# Patient Record
Sex: Female | Born: 1945 | Race: Black or African American | Hispanic: No | Smoking: Former smoker
Health system: Southern US, Community
[De-identification: ages and names within clinical notes are randomized; demographics above are authoritative.]

## PROBLEM LIST (undated history)

## (undated) DIAGNOSIS — E785 Hyperlipidemia, unspecified: Secondary | ICD-10-CM

## (undated) DIAGNOSIS — D649 Anemia, unspecified: Secondary | ICD-10-CM

## (undated) DIAGNOSIS — R7303 Prediabetes: Secondary | ICD-10-CM

## (undated) DIAGNOSIS — F419 Anxiety disorder, unspecified: Secondary | ICD-10-CM

## (undated) DIAGNOSIS — M1990 Unspecified osteoarthritis, unspecified site: Secondary | ICD-10-CM

## (undated) DIAGNOSIS — I10 Essential (primary) hypertension: Secondary | ICD-10-CM

## (undated) HISTORY — DX: Hyperlipidemia, unspecified: E78.5

## (undated) HISTORY — DX: Essential (primary) hypertension: I10

## (undated) HISTORY — PX: ABDOMINAL HYSTERECTOMY: SHX81

## (undated) HISTORY — PX: TOTAL KNEE ARTHROPLASTY: SHX125

## (undated) HISTORY — PX: COLONOSCOPY: SHX174

## (undated) HISTORY — PX: BREAST CYST EXCISION: SHX579

---

## 1973-12-15 HISTORY — PX: OTHER SURGICAL HISTORY: SHX169

## 2001-04-01 ENCOUNTER — Other Ambulatory Visit: Admission: RE | Admit: 2001-04-01 | Discharge: 2001-04-01 | Payer: Self-pay | Admitting: Family Medicine

## 2001-07-13 ENCOUNTER — Ambulatory Visit (HOSPITAL_COMMUNITY): Admission: RE | Admit: 2001-07-13 | Discharge: 2001-07-13 | Payer: Self-pay | Admitting: Family Medicine

## 2001-07-13 ENCOUNTER — Encounter: Payer: Self-pay | Admitting: Family Medicine

## 2002-02-28 ENCOUNTER — Other Ambulatory Visit: Admission: RE | Admit: 2002-02-28 | Discharge: 2002-02-28 | Payer: Self-pay | Admitting: Family Medicine

## 2002-07-19 ENCOUNTER — Encounter: Payer: Self-pay | Admitting: Family Medicine

## 2002-07-19 ENCOUNTER — Ambulatory Visit (HOSPITAL_COMMUNITY): Admission: RE | Admit: 2002-07-19 | Discharge: 2002-07-19 | Payer: Self-pay | Admitting: Family Medicine

## 2003-07-24 ENCOUNTER — Encounter: Payer: Self-pay | Admitting: Family Medicine

## 2003-07-24 ENCOUNTER — Ambulatory Visit (HOSPITAL_COMMUNITY): Admission: RE | Admit: 2003-07-24 | Discharge: 2003-07-24 | Payer: Self-pay | Admitting: Family Medicine

## 2004-07-30 ENCOUNTER — Ambulatory Visit (HOSPITAL_COMMUNITY): Admission: RE | Admit: 2004-07-30 | Discharge: 2004-07-30 | Payer: Self-pay | Admitting: Family Medicine

## 2004-08-06 ENCOUNTER — Ambulatory Visit (HOSPITAL_COMMUNITY): Admission: RE | Admit: 2004-08-06 | Discharge: 2004-08-06 | Payer: Self-pay | Admitting: Family Medicine

## 2004-08-29 ENCOUNTER — Emergency Department (HOSPITAL_COMMUNITY): Admission: EM | Admit: 2004-08-29 | Discharge: 2004-08-29 | Payer: Self-pay | Admitting: Emergency Medicine

## 2005-08-25 ENCOUNTER — Ambulatory Visit (HOSPITAL_COMMUNITY): Admission: RE | Admit: 2005-08-25 | Discharge: 2005-08-25 | Payer: Self-pay | Admitting: Family Medicine

## 2005-09-05 ENCOUNTER — Ambulatory Visit (HOSPITAL_COMMUNITY): Admission: RE | Admit: 2005-09-05 | Discharge: 2005-09-05 | Payer: Self-pay | Admitting: Family Medicine

## 2005-10-01 ENCOUNTER — Ambulatory Visit: Payer: Self-pay | Admitting: Internal Medicine

## 2005-10-02 ENCOUNTER — Encounter: Payer: Self-pay | Admitting: Internal Medicine

## 2005-10-02 ENCOUNTER — Ambulatory Visit (HOSPITAL_COMMUNITY): Admission: RE | Admit: 2005-10-02 | Discharge: 2005-10-02 | Payer: Self-pay | Admitting: Internal Medicine

## 2005-12-18 ENCOUNTER — Encounter: Payer: Self-pay | Admitting: Obstetrics and Gynecology

## 2005-12-18 ENCOUNTER — Inpatient Hospital Stay (HOSPITAL_COMMUNITY): Admission: RE | Admit: 2005-12-18 | Discharge: 2005-12-19 | Payer: Self-pay | Admitting: Obstetrics and Gynecology

## 2006-09-21 ENCOUNTER — Ambulatory Visit (HOSPITAL_COMMUNITY): Admission: RE | Admit: 2006-09-21 | Discharge: 2006-09-21 | Payer: Self-pay | Admitting: Family Medicine

## 2007-10-21 ENCOUNTER — Ambulatory Visit (HOSPITAL_COMMUNITY): Admission: RE | Admit: 2007-10-21 | Discharge: 2007-10-21 | Payer: Self-pay | Admitting: Family Medicine

## 2008-10-24 ENCOUNTER — Ambulatory Visit (HOSPITAL_COMMUNITY): Admission: RE | Admit: 2008-10-24 | Discharge: 2008-10-24 | Payer: Self-pay | Admitting: Family Medicine

## 2009-11-12 ENCOUNTER — Ambulatory Visit (HOSPITAL_COMMUNITY): Admission: RE | Admit: 2009-11-12 | Discharge: 2009-11-12 | Payer: Self-pay | Admitting: Family Medicine

## 2010-11-19 ENCOUNTER — Ambulatory Visit (HOSPITAL_COMMUNITY)
Admission: RE | Admit: 2010-11-19 | Discharge: 2010-11-19 | Payer: Self-pay | Source: Home / Self Care | Admitting: Family Medicine

## 2010-11-29 ENCOUNTER — Ambulatory Visit (HOSPITAL_COMMUNITY)
Admission: RE | Admit: 2010-11-29 | Discharge: 2010-11-29 | Payer: Self-pay | Source: Home / Self Care | Attending: Family Medicine | Admitting: Family Medicine

## 2010-12-24 ENCOUNTER — Ambulatory Visit (HOSPITAL_COMMUNITY)
Admission: RE | Admit: 2010-12-24 | Discharge: 2010-12-24 | Payer: Self-pay | Source: Home / Self Care | Attending: Family Medicine | Admitting: Family Medicine

## 2011-01-05 ENCOUNTER — Encounter: Payer: Self-pay | Admitting: Family Medicine

## 2011-05-02 NOTE — Op Note (Signed)
NAME:  [name], [name]         ACCOUNT NO.:  [id number]   MEDICAL RECORD NO.:  [id number]          PATIENT TYPE:  AMB   LOCATION:  DAY                           FACILITY:  [facility]   PHYSICIAN:  R. Roetta Sessions, M.D. DATE OF BIRTH:  April 03, 1946   DATE OF PROCEDURE:  10/02/2005  DATE OF DISCHARGE:                                 OPERATIVE REPORT   PROCEDURE PERFORMED:  Colonoscopy screening with biopsy.   INDICATIONS FOR PROCEDURE:  The patient is a 65 year old lady referred  through courtesy of Angus G. McInnis, MD for colorectal cancer screening.  She has no lower GI tract symptoms.  She has never had her lower GI tract  imaged.  There is no family history of colorectal neoplasia.  Colonoscopy is  now being done as a screening maneuver.  This approach has been discussed  with the patient at length.  Potential risks, benefits and alternatives have  been reviewed, questions have been answered, she is agreeable.  Please see  documentation in the medical records.   PROCEDURE NOTE:  Oxygen saturations, blood pressure, pulse and respirations  were monitored throughout the entire procedure.  Conscious sedation Versed 3  mg IV, Demerol 75 mg IV in divided doses.   INSTRUMENT USED:  Olympus video chip system.   FINDINGS:  Digital exam revealed no abnormalities.   ENDOSCOPIC FINDINGS:  Prep was good.   Rectum:  Examination of the rectal mucosa including retroflex view of the  anal verge revealed no abnormalities.  Colon:  Colonic mucosa was surveyed from the rectosigmoid junction to the  left, transverse and right colon to the area of the appendiceal orifice and  ileocecal valve and cecum.  These structures were well seen and photographed  for the record.  From this level, the scope was slowly withdrawn.  All  previously mentioned mucosal surfaces were again seen.  The patient had a  couple of 2 to 3 mm mammillations in the sigmoid colon around 30 cm. The  remainder of the colonic  mucosa was well seen and appeared normal.  This  area of abnormality was biopsied for histologic study.  The patient  tolerated the procedure well, was reacted in endoscopy.   IMPRESSION:  1.  Normal rectum.  2.  1 to 2 mm mammillations at 30 cm of doubtful clinical significance,      biopsied.  Otherwise normal colon.   RECOMMENDATIONS:  1.  Follow-up on path.  2.  Further recommendations to follow.      Jonathon Bellows, M.D.  Electronically Signed     RMR/MEDQ  D:  10/02/2005  T:  10/02/2005  Job:  [id number]   cc:   Angus G. Renard Matter, MD  Fax: [phone]

## 2011-05-02 NOTE — Discharge Summary (Signed)
[name], [name]         ACCOUNT NO.:  [id number]   MEDICAL RECORD NO.:  [id number]          PATIENT TYPE:  INP   LOCATION:  A427                          FACILITY:  [facility]   PHYSICIAN:  Tilda Burrow, M.D. DATE OF BIRTH:  May 21, 1946   DATE OF ADMISSION:  12/18/2005  DATE OF DISCHARGE:  01/05/2007LH                                 DISCHARGE SUMMARY   ADMISSION DIAGNOSES:  1.  Uterine cystocele.  2.  Mild rectocele.  3.  Left labia majora sebaceous cyst.   DISCHARGE DIAGNOSES:  1.  Uterine cystocele.  2.  Mild rectocele.  3.  Left labia majora sebaceous cyst.   PROCEDURES:  1.  On December 18, 2005, vaginal hysterectomy with bilateral salpingo-      oophorectomy, anterior and posterior repair.  2.  Excision of left labial sebaceous cyst.   DISCHARGE MEDICATIONS:  1.  Evista 60 mg p.o. daily.  2.  Toprol XL 25 mg p.o. daily.  3.  Centrum Silver one tablet daily.  4.  Calcium 600 mg Plus D two tablets daily.  5.  Clorazepate __________ mg p.o. p.r.n. anxiety.  6.  Stool softener daily.  7.  Colace 100 mg b.i.d.  8.  Vicodin 5/500 one q.4 h p.r.n. pain, dispensed #30.   HOSPITAL SUMMARY:  This 65 year old female patient of Dr. Butch Penny was  admitted for [facility] BSO, anterior and posterior repair with removal of  sebaceous cyst as described in HPI.   HOSPITAL COURSE:  The patient was admitted, underwent hysterectomy December 18, 2005, as described in the operative report.  Pathology showed a 46 gram  uterus with benign vaginal mucosa, benign organs without evidence of  malignancy and sebaceous cyst in the left labia majora.   Postoperative course was uneventful with postoperative hemoglobin of 11.4,  hematocrit 33.6 compared to 13.5 and 39.6 on admission.  Blood type is A  positive.  No transfusions required.  Other electrolytes include potassium  3.7, BUN 13, creatinine 0.8.  The patient was discharged on December 19, 2005,  to be followed up in one week and then  four weeks.      Tilda Burrow, M.D.  Electronically Signed     JVF/MEDQ  D:  01/14/2006  T:  01/15/2006  Job:  [id number]   cc:   Angus G. Renard Matter, MD  Fax: [phone]

## 2011-05-02 NOTE — H&P (Signed)
NAME:  [name], [name]         ACCOUNT NO.:  [id number]   MEDICAL RECORD NO.:  [id number]          PATIENT TYPE:  INP   LOCATION:  A427                          FACILITY:  [facility]   PHYSICIAN:  Tilda Burrow, M.D. DATE OF BIRTH:  [birth year]   DATE OF ADMISSION:  12/18/2005  DATE OF DISCHARGE:  LH                                HISTORY & PHYSICAL   ADMISSION DIAGNOSIS:  Second degree uterine descensus, mild cystocele, mild  rectocele, left labial majora sebaceous cyst.  This 65 year old referred  through the courtesy of Dr. Butch Penny was admitted for vaginal  hysterectomy and bilateral salpingo-oophorectomy with anterior and posterior  repair.  Additionally the patient was followed for a sebaceous cyst in the  right labia majora.  Plans are for repair of these on December 18, 2005.   Referral was performed initially in October due to cervix protruding through  the introitus.  This has begun to give her symptomatic discomfort.  She also  has mild urinary symptoms of urgency.  She has rectal pressure and  occasionally has to splint in order effectively defecate.  Exam has  confirmed the cervix to protrude in through the introitus with cough, and a  mild rectocele, and a mild cystocele.  There appears to be very good  urethral support.  GC and Chlamydia cultures negative.  Pap smear is a class  I.   PAST MEDICAL HISTORY:  Positive for mild hypertension.   SOCIAL HISTORY:  Smokes one half pack per day.   PAST SURGICAL HISTORY:  Positive for breast cyst aspiration x2.   MEDICATIONS:  1.  Toprol _50__ mg p.o. daily.  2.  Evista 60 mg p.o. daily.  3.  Clorazepate 7.5 mg p.o. as needed for anxiety.   EXAM:  GENERAL:  Height 5 foot 1 inch, weight 164, blood pressure 160/86,  last office visit improved on presentation today.  Pupils equal, round and  reactive.  NECK:  Supple.  CHEST:  Clear to auscultation.  ABDOMEN:  Nontender.  EXTERNAL GENITALIA:  Actually quite good,  peroneal support at the introitus,  but a lax rectum slightly higher in vaginal wall.  She has a cervix which  protrudes through the introitus with coughing.  She has short vaginal length  consistent with her overall petite stature.  EXTREMITIES:  Grossly normal.   IMPRESSION:  Secondary uterine descensus, mild cystocele and rectocele.   PLAN:  Total vaginal hysterectomy/bilateral salpingo-oophorectomy.  Also  removal of left labial sebaceous cyst on December 18, 2005.      Tilda Burrow, M.D.  Electronically Signed     JVF/MEDQ  D:  12/18/2005  T:  12/18/2005  Job:  [id number]   cc:   Angus G. Renard Matter, MD  Fax: [phone]

## 2011-05-02 NOTE — Op Note (Signed)
[name], [name]         ACCOUNT NO.:  [id number]   MEDICAL RECORD NO.:  [id number]          PATIENT TYPE:  INP   LOCATION:  A427                          FACILITY:  [facility]   PHYSICIAN:  Tilda Burrow, M.D. DATE OF BIRTH:  1946-07-15   DATE OF PROCEDURE:  12/18/2005  DATE OF DISCHARGE:                                 OPERATIVE REPORT   PREOPERATIVE DIAGNOSES:  1.  Second degree uterine descensus.  2.  Mild cystocele.  3.  Mild rectocele.  4.  Left labial sebaceous cyst.   POSTOPERATIVE DIAGNOSES:  1.  Second degree uterine descensus.  2.  Mild cystocele.  3.  Mild rectocele.  4.  Left labial sebaceous cyst.   PROCEDURES:  1.  Vaginal hysterectomy with bilateral salpingo-oophorectomy, anterior and      posterior repair.  2.  Excision of left labial sebaceous cyst.   SURGEON:  Tilda Burrow, M.D.   ASSISTANTElliot Cousin, CST.   ANESTHESIA:  General anesthesia.   COMPLICATIONS:  None.   FINDINGS:  Small uterus, elongated cervix protruding through introitus.  Short vaginal length.  Well supported tubes and ovaries.   DESCRIPTION OF PROCEDURE:  Patient was taken to the operating room, prepped  and draped in the usual fashion for lower abdominal vaginal surgery, prepped  in the usual way for vaginal surgery with legs in high lithotomy leg  supports.  The cervix was grasped with single-tooth tenaculum, circumscribed  with Bovie cautery after infiltration with Xylocaine with epinephrine.  The  posterior colpotomy incision was easily performed identifying the cul-de-  sac.  Weighted speculum was placed in the posterior vagina allowing [place] to  grasp the uterosacral ligaments with curved Zeppelin clamp, cutting these  ligaments and suture ligating them with tagging of the pedicle.  We then  proceeded with elevating the bladder anteriorly.  The bladder peritoneum  could not be entered initially but the lower cardinal could be identified,  clamped, cut, and suture  ligated using 0 chromic.  Upper cardinal ligaments  were treated similarly and then the anterior peritoneum was easily entered.  The bladder was retracted anteriorly with the retractor and then we marched  up the broad ligament, clamping, cutting and suture ligating up to the level  of the round ligament.  The round ligament was clamped, cut, and suture  ligated separately on each side and then the adnexa crossclamped and he  ovary and uterus removed.  The fallopian tube and utero-ovarian ligaments  were tied and tagged for further identification.  Retraction downward on  this ligament allowed adequate exposure of the ovaries so we could grasp the  ovary with a Babcock clamp, retract it sufficiently inferior to crossclamp  with a Zeppelin clamp just above the ovary.  The left side was performed  first.  The ovaries were well supported and this was technically challenged  and this was the most challenging part of the case, but we were able to  place the Z clamp very close, adjacent to the ovary but removed the ovary,  putting a double ligature on the pedicle.  The right tube and ovary were  treated similarly.  The ovary on this side was taken out with some  fragmentation as we had the Zeppelin clamp across the tip of the ovary  itself.   Hemostasis of the upper pelvis was confirmed and there was some oozing on  the posterior vaginal cuff only.  The uterosacral ligaments were then pulled  in the midline with 2-0 Prolene suture and the peritoneum closed in a  transverse fashion using running 2-0 chromic.  The uterosacral ligaments  were pulled together in the midline, resulting in good vaginal apex support.  The posterior cuff was closed.   Anterior repair consisted of removing a relatively small 2 cm x 2cm portion  of anterior vaginal mucosa and reapproximating the skin edges in the midline  which resulted in improved bladder support in the apex.   Posterior repair was then performed.   Posterior repair was preceded by  double gloved digital rectal exam confirming that the introitus was in good  shape.  The mid portion of the vagina required support.  Therefore, I split  the mucosa from the hymen around it at the posterior fourchette upward for a  distance of about 3 cm, undermined the skin on either side and placed three  horizontal mattress sutures of 0 Dexon in such a way as to pull pararectal  supportive tissues over and reinforce the peroneal body.  The vaginal mucosa  itself did not require trimming but the support was significantly improved.  The patient then had closure of the vaginal mucosa.  (Obviously, gloves were  changed after rectal exam).   Repair was quite hemostatic.   At this time, the vaginal support was quite good.  We then preceded with  removal of the sebaceous cyst which required removing a football-shaped  ellipse of skin, 1.5 cm in length x 1 cm wide, and then grasping with Allis  clamp, retracting it and coring out the sebaceous cyst and around the  inflammatory tissue.  The remaining bed was relatively hemostatic and  __edges__ will be pulled together in the midline with transverse interrupted  2-0 chromics.  Patient tolerated the procedure well and was returned to the  recovery room in good condition.      Tilda Burrow, M.D.  Electronically Signed     JVF/MEDQ  D:  12/18/2005  T:  12/18/2005  Job:  [id number]   cc:   Angus G. Renard Matter, MD  Fax: [phone]

## 2011-11-27 ENCOUNTER — Other Ambulatory Visit (HOSPITAL_COMMUNITY): Payer: Self-pay | Admitting: Family Medicine

## 2011-11-27 DIAGNOSIS — Z139 Encounter for screening, unspecified: Secondary | ICD-10-CM

## 2011-12-01 ENCOUNTER — Ambulatory Visit (HOSPITAL_COMMUNITY)
Admission: RE | Admit: 2011-12-01 | Discharge: 2011-12-01 | Disposition: A | Discharge status: Home / Self Care | Payer: Medicare Other | Source: Ambulatory Visit | Attending: Family Medicine | Admitting: Family Medicine

## 2011-12-01 DIAGNOSIS — Z1231 Encounter for screening mammogram for malignant neoplasm of breast: Secondary | ICD-10-CM | POA: Insufficient documentation

## 2011-12-01 DIAGNOSIS — Z139 Encounter for screening, unspecified: Secondary | ICD-10-CM

## 2012-11-29 ENCOUNTER — Other Ambulatory Visit (HOSPITAL_COMMUNITY): Payer: Self-pay | Admitting: Family Medicine

## 2012-11-29 DIAGNOSIS — Z139 Encounter for screening, unspecified: Secondary | ICD-10-CM

## 2012-12-13 ENCOUNTER — Ambulatory Visit (HOSPITAL_COMMUNITY)
Admission: RE | Admit: 2012-12-13 | Discharge: 2012-12-13 | Disposition: A | Discharge status: Home / Self Care | Payer: Medicare Other | Source: Ambulatory Visit | Attending: Family Medicine | Admitting: Family Medicine

## 2012-12-13 DIAGNOSIS — Z139 Encounter for screening, unspecified: Secondary | ICD-10-CM

## 2012-12-13 DIAGNOSIS — Z1231 Encounter for screening mammogram for malignant neoplasm of breast: Secondary | ICD-10-CM | POA: Insufficient documentation

## 2013-03-07 ENCOUNTER — Other Ambulatory Visit (HOSPITAL_COMMUNITY): Payer: Self-pay | Admitting: Family Medicine

## 2013-03-07 ENCOUNTER — Ambulatory Visit (HOSPITAL_COMMUNITY)
Admission: RE | Admit: 2013-03-07 | Discharge: 2013-03-07 | Disposition: A | Discharge status: Home / Self Care | Payer: Medicare Other | Source: Ambulatory Visit | Attending: Family Medicine | Admitting: Family Medicine

## 2013-03-07 DIAGNOSIS — Z780 Asymptomatic menopausal state: Secondary | ICD-10-CM | POA: Insufficient documentation

## 2013-03-07 DIAGNOSIS — M949 Disorder of cartilage, unspecified: Secondary | ICD-10-CM | POA: Insufficient documentation

## 2013-03-07 DIAGNOSIS — M810 Age-related osteoporosis without current pathological fracture: Secondary | ICD-10-CM

## 2013-03-07 DIAGNOSIS — M899 Disorder of bone, unspecified: Secondary | ICD-10-CM | POA: Insufficient documentation

## 2014-01-20 ENCOUNTER — Other Ambulatory Visit (HOSPITAL_COMMUNITY): Payer: Self-pay | Admitting: Family Medicine

## 2014-01-20 DIAGNOSIS — Z139 Encounter for screening, unspecified: Secondary | ICD-10-CM

## 2014-01-24 ENCOUNTER — Ambulatory Visit (HOSPITAL_COMMUNITY)
Admission: RE | Admit: 2014-01-24 | Discharge: 2014-01-24 | Disposition: A | Discharge status: Home / Self Care | Payer: Medicare Other | Source: Ambulatory Visit | Attending: Family Medicine | Admitting: Family Medicine

## 2014-01-24 DIAGNOSIS — Z1231 Encounter for screening mammogram for malignant neoplasm of breast: Secondary | ICD-10-CM | POA: Insufficient documentation

## 2014-01-24 DIAGNOSIS — Z139 Encounter for screening, unspecified: Secondary | ICD-10-CM

## 2014-07-18 ENCOUNTER — Other Ambulatory Visit (HOSPITAL_COMMUNITY): Payer: Self-pay | Admitting: Family Medicine

## 2014-07-18 ENCOUNTER — Ambulatory Visit (HOSPITAL_COMMUNITY)
Admission: RE | Admit: 2014-07-18 | Discharge: 2014-07-18 | Disposition: A | Discharge status: Home / Self Care | Payer: Medicare Other | Source: Ambulatory Visit | Attending: Family Medicine | Admitting: Family Medicine

## 2014-07-18 DIAGNOSIS — Q762 Congenital spondylolisthesis: Secondary | ICD-10-CM | POA: Diagnosis not present

## 2014-07-18 DIAGNOSIS — M545 Low back pain, unspecified: Secondary | ICD-10-CM | POA: Diagnosis not present

## 2014-07-18 DIAGNOSIS — M5442 Lumbago with sciatica, left side: Secondary | ICD-10-CM

## 2015-02-02 ENCOUNTER — Other Ambulatory Visit: Payer: Self-pay

## 2015-02-02 DIAGNOSIS — Z1231 Encounter for screening mammogram for malignant neoplasm of breast: Secondary | ICD-10-CM

## 2015-02-05 ENCOUNTER — Ambulatory Visit
Admission: RE | Admit: 2015-02-05 | Discharge: 2015-02-05 | Disposition: A | Discharge status: Home / Self Care | Payer: Medicare Other | Source: Ambulatory Visit

## 2015-02-05 DIAGNOSIS — Z1231 Encounter for screening mammogram for malignant neoplasm of breast: Secondary | ICD-10-CM

## 2015-02-20 ENCOUNTER — Other Ambulatory Visit (HOSPITAL_COMMUNITY): Payer: Self-pay | Admitting: Family Medicine

## 2015-02-20 DIAGNOSIS — Z13820 Encounter for screening for osteoporosis: Secondary | ICD-10-CM

## 2015-02-20 DIAGNOSIS — Z780 Asymptomatic menopausal state: Secondary | ICD-10-CM

## 2015-03-13 ENCOUNTER — Ambulatory Visit (HOSPITAL_COMMUNITY)
Admission: RE | Admit: 2015-03-13 | Discharge: 2015-03-13 | Disposition: A | Discharge status: Home / Self Care | Payer: Medicare Other | Source: Ambulatory Visit | Attending: Family Medicine | Admitting: Family Medicine

## 2015-03-13 DIAGNOSIS — Z13820 Encounter for screening for osteoporosis: Secondary | ICD-10-CM

## 2015-03-13 DIAGNOSIS — Z780 Asymptomatic menopausal state: Secondary | ICD-10-CM | POA: Diagnosis not present

## 2015-09-03 ENCOUNTER — Telehealth: Payer: Self-pay

## 2015-09-03 NOTE — Telephone Encounter (Signed)
RECALL FOR TCS °

## 2015-09-04 NOTE — Telephone Encounter (Signed)
Letter mailed to pt.  

## 2015-11-19 ENCOUNTER — Telehealth: Payer: Self-pay

## 2015-11-19 NOTE — Telephone Encounter (Signed)
[phone] PATIENT RECEIVED LETTER TO SCHEDULE TCS

## 2015-11-20 ENCOUNTER — Telehealth: Payer: Self-pay

## 2015-11-21 NOTE — Telephone Encounter (Signed)
See separate triage.  

## 2015-11-21 NOTE — Telephone Encounter (Signed)
Appropriate.

## 2015-11-21 NOTE — Telephone Encounter (Signed)
Gastroenterology Pre-Procedure Review  Request Date: 11/28/2015 Requesting Physician: PT WAS ON RECALL/ LAST COLONOSCOPY WAS 09/2005 BY DR.ROURK  PATIENT REVIEW QUESTIONS: The patient responded to the following health history questions as indicated:    1. Diabetes Melitis: no 2. Joint replacements in the past 12 months: no 3. Major health problems in the past 3 months: no 4. Has an artificial valve or MVP: no 5. Has a defibrillator: no 6. Has been advised in past to take antibiotics in advance of a procedure like teeth cleaning: no 7. Family history of colon cancer: no  8. Alcohol Use: yes             SOCIALLY/ NOT ON REGULAR BASIS 9. History of sleep apnea: no     MEDICATIONS & ALLERGIES:    Patient reports the following regarding taking any blood thinners:   Plavix? no Aspirin? no Coumadin? no  Patient confirms/reports the following medications:  Current Outpatient Prescriptions  Medication Sig Dispense Refill  . amLODipine (NORVASC) 10 MG tablet Take 10 mg by mouth daily.    . calcium carbonate (OSCAL) 1500 (600 CA) MG TABS tablet Take by mouth daily with breakfast.    . clorazepate (TRANXENE) 7.5 MG tablet Take 7.5 mg by mouth 2 (two) times daily as needed for anxiety. Takes very seldom   Maybe once or twice a month    . ferrous sulfate 325 (65 FE) MG tablet Take 325 mg by mouth daily with breakfast.    . metoprolol (LOPRESSOR) 50 MG tablet Take 50 mg by mouth daily.     . Multiple Vitamins-Minerals (CENTRUM SILVER PO) Take by mouth.     No current facility-administered medications for this visit.    Patient confirms/reports the following allergies:  No Known Allergies  No orders of the defined types were placed in this encounter.    AUTHORIZATION INFORMATION Primary Insurance:  ID #: ,  Group #:  Pre-Cert / Auth required: Pre-Cert / Auth #:   Secondary Insurance:   ID #:   Group #:  Pre-Cert / Auth required:  Pre-Cert / Auth #:   SCHEDULE  INFORMATION: Procedure has been scheduled as follows:  Date:   11/28/2015                  Time: 1:00 pm Location: [facility] Stay  This Gastroenterology Pre-Precedure Review Form is being routed to the following provider(s): R. Roetta SessionsMichael Rourk, MD

## 2015-11-22 ENCOUNTER — Other Ambulatory Visit: Payer: Self-pay

## 2015-11-22 DIAGNOSIS — Z1211 Encounter for screening for malignant neoplasm of colon: Secondary | ICD-10-CM

## 2015-11-22 MED ORDER — PEG 3350-KCL-NA BICARB-NACL 420 G PO SOLR: 4000.0000 mL | ORAL | Status: DC

## 2015-11-22 NOTE — Telephone Encounter (Signed)
Rx sent to the pharmacy. Instructions faxed to pharmacy. LMOM at home for pt to [place] beginning today.

## 2015-11-22 NOTE — Addendum Note (Signed)
Addended by: Lavena BullionSTEWART, Mitsugi Schrader H on: 11/22/2015 02:54 PM   Modules accepted: Orders

## 2015-11-26 ENCOUNTER — Telehealth: Payer: Self-pay

## 2015-11-26 NOTE — Telephone Encounter (Signed)
I called [employer] @ [phone] and spoke to Clydie BraunKaren C who said a PA is not required for the screening colonoscopy.

## 2015-11-28 ENCOUNTER — Encounter (HOSPITAL_COMMUNITY): Payer: Self-pay | Admitting: *Deleted

## 2015-11-28 ENCOUNTER — Ambulatory Visit (HOSPITAL_COMMUNITY)
Admission: RE | Admit: 2015-11-28 | Discharge: 2015-11-28 | Disposition: A | Discharge status: Home / Self Care | Payer: Medicare Other | Source: Ambulatory Visit | Attending: Internal Medicine | Admitting: Internal Medicine

## 2015-11-28 ENCOUNTER — Encounter (HOSPITAL_COMMUNITY)
Admission: RE | Disposition: A | Discharge status: Home / Self Care | Payer: Self-pay | Source: Ambulatory Visit | Attending: Internal Medicine

## 2015-11-28 DIAGNOSIS — Z79899 Other long term (current) drug therapy: Secondary | ICD-10-CM | POA: Diagnosis not present

## 2015-11-28 DIAGNOSIS — Z87891 Personal history of nicotine dependence: Secondary | ICD-10-CM | POA: Diagnosis not present

## 2015-11-28 DIAGNOSIS — I10 Essential (primary) hypertension: Secondary | ICD-10-CM | POA: Diagnosis not present

## 2015-11-28 DIAGNOSIS — Z1211 Encounter for screening for malignant neoplasm of colon: Secondary | ICD-10-CM | POA: Insufficient documentation

## 2015-11-28 DIAGNOSIS — F419 Anxiety disorder, unspecified: Secondary | ICD-10-CM | POA: Insufficient documentation

## 2015-11-28 HISTORY — DX: Essential (primary) hypertension: I10

## 2015-11-28 HISTORY — PX: COLONOSCOPY: SHX5424

## 2015-11-28 HISTORY — DX: Anxiety disorder, unspecified: F41.9

## 2015-11-28 SURGERY — COLONOSCOPY
Anesthesia: Moderate Sedation

## 2015-11-28 MED ORDER — STERILE WATER FOR IRRIGATION IR SOLN
Status: DC | PRN
  Administered 2015-11-28: 2.5 mL

## 2015-11-28 MED ORDER — SODIUM CHLORIDE 0.9 % IV SOLN
INTRAVENOUS | Status: DC
  Administered 2015-11-28: 13:00:00 via INTRAVENOUS

## 2015-11-28 MED ORDER — MIDAZOLAM HCL 5 MG/5ML IJ SOLN
INTRAMUSCULAR | Status: DC | PRN
  Administered 2015-11-28: 2 mg via INTRAVENOUS
  Administered 2015-11-28: 1 mg via INTRAVENOUS
  Administered 2015-11-28: 2 mg via INTRAVENOUS

## 2015-11-28 MED ORDER — ONDANSETRON HCL 4 MG/2ML IJ SOLN
INTRAMUSCULAR | Status: DC | PRN
  Administered 2015-11-28: 4 mg via INTRAVENOUS

## 2015-11-28 MED ORDER — ONDANSETRON HCL 4 MG/2ML IJ SOLN
INTRAMUSCULAR | Status: DC
Start: 2015-11-28 — End: 2015-11-28
  Filled 2015-11-28: qty 2

## 2015-11-28 MED ORDER — MIDAZOLAM HCL 5 MG/5ML IJ SOLN
INTRAMUSCULAR | Status: AC
  Filled 2015-11-28: qty 10

## 2015-11-28 MED ORDER — MEPERIDINE HCL 100 MG/ML IJ SOLN
INTRAMUSCULAR | Status: DC
Start: 2015-11-28 — End: 2015-11-28
  Filled 2015-11-28: qty 2

## 2015-11-28 MED ORDER — MEPERIDINE HCL 100 MG/ML IJ SOLN
INTRAMUSCULAR | Status: DC | PRN
  Administered 2015-11-28: 50 mg via INTRAVENOUS
  Administered 2015-11-28: 25 mg via INTRAVENOUS

## 2015-11-28 NOTE — H&P (Signed)
 @LOGO @   Primary Care Physician:  Alice ReichertMCINNIS,ANGUS G, MD Primary Gastroenterologist:  Dr. Jena Gaussourk  Pre-Procedure History & Physical: HPI:  Kaitlyn Fillerlizabeth B Quirion is a 69 y.o. female is here for a screening colonoscopy. Negative colonoscopy 10 years ago. No family history of colon cancer.  No bowel symptoms.  Past Medical History  Diagnosis Date  . Hypertension   . Anxiety     Past Surgical History  Procedure Laterality Date  . Colonoscopy    . Abdominal hysterectomy    . Right  breast lumpectomy  1975    benign    Prior to Admission medications   Medication Sig Start Date End Date Taking? Authorizing Provider  amLODipine (NORVASC) 10 MG tablet Take 10 mg by mouth daily.   Yes Historical Provider, MD  calcium carbonate (OSCAL) 1500 (600 CA) MG TABS tablet Take by mouth daily with breakfast.   Yes Historical Provider, MD  ferrous sulfate 325 (65 FE) MG tablet Take 325 mg by mouth daily with breakfast.   Yes Historical Provider, MD  metoprolol (LOPRESSOR) 50 MG tablet Take 50 mg by mouth daily.    Yes Historical Provider, MD  Multiple Vitamins-Minerals (CENTRUM SILVER PO) Take 1 tablet by mouth daily.    Yes Historical Provider, MD  polyethylene glycol-electrolytes (TRILYTE) 420 G solution Take 4,000 mLs by mouth as directed. 11/22/15  Yes Corbin Adeobert M Tiffony Kite, MD  clorazepate (TRANXENE) 7.5 MG tablet Take 7.5 mg by mouth 2 (two) times daily as needed for anxiety. Takes very seldom   Maybe once or twice a month    Historical Provider, MD    Allergies as of 11/22/2015  . (No Known Allergies)    No family history on file.  Social History   Social History  . Marital Status: Widowed    Spouse Name: N/A  . Number of Children: N/A  . Years of Education: N/A   Occupational History  . Not on file.   Social History Main Topics  . Smoking status: Former Smoker -- 0.25 packs/day for 40 years    Types: Cigarettes    Quit date: 11/27/2009  . Smokeless tobacco: Not on file  . Alcohol Use:  Yes     Comment: Socially  . Drug Use: No  . Sexual Activity: Not on file   Other Topics Concern  . Not on file   Social History Narrative  . No narrative on file    Review of Systems: See HPI, otherwise negative ROS  Physical Exam: BP 121/61 mmHg  Pulse 76  Temp(Src) 98.6 F (37 C) (Oral)  Resp 25  Ht 5\' 1"  (1.549 m)  Wt 155 lb (70.308 kg)  BMI 29.30 kg/m2  SpO2 97% General:   Alert,  Well-developed, well-nourished, pleasant and cooperative in NAD Lungs:  Clear throughout to auscultation.   No wheezes, crackles, or rhonchi. No acute distress. Heart:  Regular rate and rhythm; no murmurs, clicks, rubs,  or gallops. Abdomen:  Soft, nontender and nondistended. No masses, hepatosplenomegaly or hernias noted. Normal bowel sounds, without guarding, and without rebound.     Impression/Plan: [name] is now here to undergo a screening colonoscopy.   Average risk screening examination.  Risks, benefits, limitations, imponderables and alternatives regarding colonoscopy have been reviewed with the patient. Questions have been answered. All parties agreeable.     Notice:  This dictation was prepared with Dragon dictation along with smaller phrase technology. Any transcriptional errors that result from this process are unintentional and may not be  corrected upon review.

## 2015-11-28 NOTE — Discharge Instructions (Signed)
°  Colonoscopy Discharge Instructions  Read the instructions outlined below and refer to this sheet in the next few weeks. These discharge instructions provide you with general information on caring for yourself after you leave the hospital. Your doctor may also give you specific instructions. While your treatment has been planned according to the most current medical practices available, unavoidable complications occasionally occur. If you have any problems or questions after discharge, call Dr. Jena Gaussourk at [phone]. ACTIVITY  You may resume your regular activity, but move at a slower pace for the next 24 hours.   Take frequent rest periods for the next 24 hours.   Walking will help get rid of the air and reduce the bloated feeling in your belly (abdomen).   No driving for 24 hours (because of the medicine (anesthesia) used during the test).    Do not sign any important legal documents or operate any machinery for 24 hours (because of the anesthesia used during the test).  NUTRITION  Drink plenty of fluids.   You may resume your normal diet as instructed by your doctor.   Begin with a light meal and progress to your normal diet. Heavy or fried foods are harder to digest and may make you feel sick to your stomach (nauseated).   Avoid alcoholic beverages for 24 hours or as instructed.  MEDICATIONS  You may resume your normal medications unless your doctor tells you otherwise.  WHAT YOU CAN EXPECT TODAY  Some feelings of bloating in the abdomen.   Passage of more gas than usual.   Spotting of blood in your stool or on the toilet paper.  IF YOU HAD POLYPS REMOVED DURING THE COLONOSCOPY:  No aspirin products for 7 days or as instructed.   No alcohol for 7 days or as instructed.   Eat a soft diet for the next 24 hours.  FINDING OUT THE RESULTS OF YOUR TEST Not all test results are available during your visit. If your test results are not back during the visit, make an appointment  with your caregiver to find out the results. Do not assume everything is normal if you have not heard from your caregiver or the medical facility. It is important for you to follow up on all of your test results.  SEEK IMMEDIATE MEDICAL ATTENTION IF:  You have more than a spotting of blood in your stool.   Your belly is swollen (abdominal distention).   You are nauseated or vomiting.   You have a temperature over 101.   You have abdominal pain or discomfort that is severe or gets worse throughout the day.    Your colonoscopy was normal today  I do not recommend a future colonoscopy unless you develop new symptoms.

## 2015-11-29 NOTE — Op Note (Signed)
[facility] [address] [place], [id number]   COLONOSCOPY PROCEDURE REPORT  PATIENT: [name], [name]  MR#: [id number] BIRTHDATE: 01-18-1946 , 69  yrs. old GENDER: female ENDOSCOPIST: R.  Roetta SessionsMichael Aerion Bagdasarian, MD FACP [facility] REFERRED XB:JYNWGBY:Angus Renard MatterMcInnis, M.D. PROCEDURE DATE:  11/28/2015 PROCEDURE:   Colonoscopy, screening INDICATIONS:Average risk colorectal cancer screening examination. MEDICATIONS: Versed 5 mg IV and Demerol 75 mg IV and divided doses. Zofran 4 mg IV. ASA CLASS:       Class II  CONSENT: The risks, benefits, alternatives and imponderables including but not limited to bleeding, perforation as well as the possibility of a missed lesion have been reviewed.  The potential for biopsy, lesion removal, etc. have also been discussed. Questions have been answered.  All parties agreeable.  Please see the history and physical in the medical record for more information.  DESCRIPTION OF PROCEDURE:   After the risks benefits and alternatives of the procedure were thoroughly explained, informed consent was obtained.  The digital rectal exam revealed no abnormalities of the rectum.   The EC-[id number] ([id number]  endoscope was introduced through the anus and advanced to the cecum, which was identified by both the appendix and ileocecal valve. No adverse events experienced.   The quality of the prep was adequate  The instrument was then slowly withdrawn as the colon was fully examined. Estimated blood loss is zero unless otherwise noted in this procedure report.      COLON FINDINGS: Normal-appearing rectal mucosa.  Normal-appearing colonic mucosa.  Retroflexion was performed. .  Withdrawal time=6 minutes 0 seconds.  The scope was withdrawn and the procedure completed. COMPLICATIONS: There were no immediate complications.  ENDOSCOPIC IMPRESSION: Normal colonoscopy  RECOMMENDATIONS: I do not recommend a future colonoscopy unless patient develops  new symptoms.  eSigned:  R. Roetta SessionsMichael Eziah Negro, MD Jerrel IvoryFACP [facility] 11/28/2015 1:34 PM   cc:  CPT CODES: ICD CODES:  The ICD and CPT codes recommended by this software are interpretations from the data that the clinical staff has captured with the software.  The verification of the translation of this report to the ICD and CPT codes and modifiers is the sole responsibility of the health care institution and practicing physician where this report was generated.  [employer] will not be held responsible for the validity of the ICD and CPT codes included on this report.  AMA assumes no liability for data contained or not contained herein. CPT is a Publishing rights managerregistered trademark of the [employer].

## 2015-12-03 ENCOUNTER — Encounter (HOSPITAL_COMMUNITY): Payer: Self-pay | Admitting: Internal Medicine

## 2016-01-25 ENCOUNTER — Other Ambulatory Visit: Payer: Self-pay

## 2016-01-25 DIAGNOSIS — Z1231 Encounter for screening mammogram for malignant neoplasm of breast: Secondary | ICD-10-CM

## 2016-02-15 ENCOUNTER — Ambulatory Visit
Admission: RE | Admit: 2016-02-15 | Discharge: 2016-02-15 | Disposition: A | Discharge status: Home / Self Care | Payer: Medicare Other | Source: Ambulatory Visit

## 2016-02-15 DIAGNOSIS — Z1231 Encounter for screening mammogram for malignant neoplasm of breast: Secondary | ICD-10-CM

## 2016-03-12 DIAGNOSIS — E119 Type 2 diabetes mellitus without complications: Secondary | ICD-10-CM | POA: Diagnosis not present

## 2016-03-12 DIAGNOSIS — I10 Essential (primary) hypertension: Secondary | ICD-10-CM | POA: Diagnosis not present

## 2016-04-15 ENCOUNTER — Ambulatory Visit (INDEPENDENT_AMBULATORY_CARE_PROVIDER_SITE_OTHER): Payer: Medicare Other | Admitting: Orthopaedic Surgery

## 2016-04-15 ENCOUNTER — Encounter: Payer: Self-pay | Admitting: Orthopaedic Surgery

## 2016-04-15 ENCOUNTER — Ambulatory Visit (HOSPITAL_COMMUNITY)
Admission: RE | Admit: 2016-04-15 | Discharge: 2016-04-15 | Disposition: A | Discharge status: Home / Self Care | Payer: Medicare Other | Source: Ambulatory Visit | Attending: Orthopaedic Surgery | Admitting: Orthopaedic Surgery

## 2016-04-15 VITALS — BP 130/74 | HR 74 | Resp 12 | Ht 61.0 in | Wt 156.0 lb

## 2016-04-15 DIAGNOSIS — M1712 Unilateral primary osteoarthritis, left knee: Secondary | ICD-10-CM | POA: Diagnosis not present

## 2016-04-15 DIAGNOSIS — M25562 Pain in left knee: Secondary | ICD-10-CM | POA: Diagnosis not present

## 2016-04-15 DIAGNOSIS — M25561 Pain in right knee: Secondary | ICD-10-CM

## 2016-04-15 DIAGNOSIS — M170 Bilateral primary osteoarthritis of knee: Secondary | ICD-10-CM | POA: Diagnosis not present

## 2016-04-15 DIAGNOSIS — M1711 Unilateral primary osteoarthritis, right knee: Secondary | ICD-10-CM | POA: Insufficient documentation

## 2016-04-15 MED ORDER — NAPROXEN 500 MG PO TABS: 500.0000 mg | ORAL_TABLET | Freq: Two times a day (BID) | ORAL | Status: AC

## 2016-04-15 NOTE — Progress Notes (Signed)
Subjective: my knees hurt    Patient ID: Axel FillerElizabeth B Colville, female    DOB: 06/05/1946, 70 y.o.   MRN: [id number]  Knee Pain  There was no injury mechanism. The pain is present in the left knee and right knee. The quality of the pain is described as aching. The pain is at a severity of 5/10. The pain is moderate. The pain has been worsening since onset. Associated symptoms include an inability to bear weight and a loss of motion. Pertinent negatives include no loss of sensation, muscle weakness, numbness or tingling. The symptoms are aggravated by weight bearing. She has tried acetaminophen, NSAIDs, ice, heat and rest for the symptoms. The treatment provided moderate relief.   She has had increasing pain to both knees over the last three months.  She has no trauma.  The knees hurt, swell and pop.  They do not give way but give the sensation they might.  The right knee hurts more.  She had seen Dr. Renard MatterMcInnis until he retired and is now seeing Dr. Hughie ClossZ. Hall.  She is concerned about the knee pain as she lives alone and heats her house by wood.  She actively cuts wood.  She has more pain in the evenings.  She has taken Aleve which helps and has used rubs.    Review of Systems  HENT: Negative for congestion.   Respiratory: Negative for cough and shortness of breath.   Cardiovascular: Negative for chest pain and leg swelling.  Endocrine: Positive for cold intolerance.  Musculoskeletal: Positive for back pain, joint swelling, arthralgias and gait problem.  Allergic/Immunologic: Positive for environmental allergies.  Neurological: Negative for tingling and numbness.  Psychiatric/Behavioral: The patient is nervous/anxious.        Past Medical History  Diagnosis Date  . Hypertension   . Anxiety     Past Surgical History  Procedure Laterality Date  . Colonoscopy    . Abdominal hysterectomy    . Right  breast lumpectomy  1975    benign  . Colonoscopy N/A 11/28/2015    Procedure:  COLONOSCOPY;  Surgeon: Corbin Adeobert M Rourk, MD;  [facility];  Service: Endoscopy;  Laterality: N/A;  1:00 PM    Current Outpatient Prescriptions on File Prior to Visit  Medication Sig Dispense Refill  . amLODipine (NORVASC) 10 MG tablet Take 10 mg by mouth daily.    . calcium carbonate (OSCAL) 1500 (600 CA) MG TABS tablet Take by mouth daily with breakfast.    . clorazepate (TRANXENE) 7.5 MG tablet Take 7.5 mg by mouth 2 (two) times daily as needed for anxiety. Takes very seldom   Maybe once or twice a month    . ferrous sulfate 325 (65 FE) MG tablet Take 325 mg by mouth daily with breakfast.    . metoprolol (LOPRESSOR) 50 MG tablet Take 50 mg by mouth daily.     . Multiple Vitamins-Minerals (CENTRUM SILVER PO) Take 1 tablet by mouth daily.     . polyethylene glycol-electrolytes (TRILYTE) 420 G solution Take 4,000 mLs by mouth as directed. 4000 mL 0   No current facility-administered medications on file prior to visit.    Social History   Social History  . Marital Status: Widowed    Spouse Name: N/A  . Number of Children: N/A  . Years of Education: N/A   Occupational History  . Not on file.   Social History Main Topics  . Smoking status: Former Smoker -- 0.25 packs/day for 40 years  Types: Cigarettes    Quit date: 11/27/2009  . Smokeless tobacco: Not on file  . Alcohol Use: Yes     Comment: Socially  . Drug Use: No  . Sexual Activity: Not on file   Other Topics Concern  . Not on file   Social History Narrative    BP 130/74 mmHg  Pulse 74  Resp 12  Ht  (1.549 m)  Wt 156 lb (70.761 kg)  BMI 29.49 kg/m2  Objective:   Physical Exam  Constitutional: She is oriented to person, place, and time. She appears well-developed and well-nourished.  HENT:  Head: Normocephalic and atraumatic.  Eyes: Conjunctivae and EOM are normal. Pupils are equal, round, and reactive to light.  Neck: Normal range of motion. Neck supple.  Cardiovascular: Normal rate, regular  rhythm and intact distal pulses.   Pulmonary/Chest: Effort normal.  Abdominal: Soft.  Musculoskeletal: She exhibits tenderness (Both knees with slight effusion, crepitus, knock knee tendency more on right, right knee hurts more, ROM 0-105 right 0-110 left, limp to the right.).       Right knee: She exhibits decreased range of motion, effusion and deformity. Tenderness found. Medial joint line tenderness noted.       Legs: Neurological: She is alert and oriented to person, place, and time. She displays normal reflexes. No cranial nerve deficit. She exhibits normal muscle tone. Coordination normal.  Skin: Skin is warm and dry.  Psychiatric: She has a normal mood and affect. Her behavior is normal. Judgment and thought content normal.   X-rays were done of both knees, reported separately.    Assessment & Plan:   Encounter Diagnoses  Name Primary?  . Right knee pain Yes  . Left knee pain     PROCEDURE NOTE:  The patient requests injections of the left knee , verbal consent was obtained.  The left knee was prepped appropriately after time out was performed.   Sterile technique was observed and injection of 1 cc of Depo-Medrol 40 mg with several cc's of plain xylocaine. Anesthesia was provided by ethyl chloride and a 20-gauge needle was used to inject the knee area. The injection was tolerated well.  A band aid dressing was applied.  The patient was advised to apply ice later today and tomorrow to the injection sight as needed.  PROCEDURE NOTE:  The patient requests injections of the right knee , verbal consent was obtained.  The right knee was prepped appropriately after time out was performed.   Sterile technique was observed and injection of 1 cc of Depo-Medrol 40 mg with several cc's of plain xylocaine. Anesthesia was provided by ethyl chloride and a 20-gauge needle was used to inject the knee area. The injection was tolerated well.  A band aid dressing was applied.  The patient  was advised to apply ice later today and tomorrow to the injection sight as needed.  I told her to stop the Aleve.  I will begin Naprosyn.  Precautions given.  Return in two weeks.  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  She is a candidate for total knee but she does not want to consider surgery at this time.  Call if any problem.

## 2016-04-29 ENCOUNTER — Ambulatory Visit (INDEPENDENT_AMBULATORY_CARE_PROVIDER_SITE_OTHER): Payer: Medicare Other | Admitting: Orthopaedic Surgery

## 2016-04-29 ENCOUNTER — Encounter: Payer: Self-pay | Admitting: Orthopaedic Surgery

## 2016-04-29 VITALS — BP 129/62 | HR 69 | Temp 97.9°F | Ht 61.0 in | Wt 156.0 lb

## 2016-04-29 DIAGNOSIS — M25562 Pain in left knee: Secondary | ICD-10-CM | POA: Diagnosis not present

## 2016-04-29 DIAGNOSIS — M25561 Pain in right knee: Secondary | ICD-10-CM

## 2016-04-29 NOTE — Progress Notes (Signed)
Patient JY:NWGNFAOZH:[name], female DOB:30-Dec-1945, 70 y.o. YQM:[id number]  Chief Complaint  Patient presents with  . Follow-up    2 week follow up bilateral knee pain    HPI  [name] is a 70 y.o. female who is seen in follow-up for bilateral knee pain.  She is much improved after the injection last time and being on the Naprosyn.  She is walking well.  She cut her grass.  She has limited discomfort now.  She feels so much better.  She has no giving way, no swelling, no redness.  HPI  Body mass index is 29.49 kg/(m^2).   Review of Systems  HENT: Negative for congestion.   Respiratory: Negative for cough and shortness of breath.   Cardiovascular: Negative for chest pain and leg swelling.  Endocrine: Positive for cold intolerance.  Musculoskeletal: Positive for back pain, joint swelling, arthralgias and gait problem.  Allergic/Immunologic: Positive for environmental allergies.  Neurological: Negative for numbness.  Psychiatric/Behavioral: The patient is nervous/anxious.     Past Medical History  Diagnosis Date  . Hypertension   . Anxiety     Past Surgical History  Procedure Laterality Date  . Colonoscopy    . Abdominal hysterectomy    . Right  breast lumpectomy  1975    benign  . Colonoscopy N/A 11/28/2015    Procedure: COLONOSCOPY;  Surgeon: Corbin Adeobert M Rourk, MD;  Location[facility];  Service: Endoscopy;  Laterality: N/A;  1:00 PM    History reviewed. No pertinent family history.  Social History Social History  Substance Use Topics  . Smoking status: Former Smoker -- 0.25 packs/day for 40 years    Types: Cigarettes    Quit date: 11/27/2009  . Smokeless tobacco: None  . Alcohol Use: Yes     Comment: Socially    No Known Allergies  Current Outpatient Prescriptions  Medication Sig Dispense Refill  . amLODipine (NORVASC) 10 MG tablet Take 10 mg by mouth daily.    . calcium carbonate (OSCAL) 1500 (600 CA) MG TABS tablet Take by mouth daily  with breakfast.    . clorazepate (TRANXENE) 7.5 MG tablet Take 7.5 mg by mouth 2 (two) times daily as needed for anxiety. Takes very seldom   Maybe once or twice a month    . ferrous sulfate 325 (65 FE) MG tablet Take 325 mg by mouth daily with breakfast.    . metoprolol (LOPRESSOR) 50 MG tablet Take 50 mg by mouth daily.     . Multiple Vitamins-Minerals (CENTRUM SILVER PO) Take 1 tablet by mouth daily.     . naproxen (NAPROSYN) 500 MG tablet Take 1 tablet (500 mg total) by mouth 2 (two) times daily with a meal. 60 tablet 5  . polyethylene glycol-electrolytes (TRILYTE) 420 G solution Take 4,000 mLs by mouth as directed. 4000 mL 0   No current facility-administered medications for this visit.     Physical Exam  Blood pressure 129/62, pulse 69, temperature 97.9 F (36.6 C), height 5\' 1"  (1.549 m), weight 156 lb (70.761 kg).  Constitutional: overall normal hygiene, normal nutrition, well developed, normal grooming, normal body habitus. Assistive device:none  Musculoskeletal: gait and station Limp none, muscle tone and strength are normal, no tremors or atrophy is present.  .  Neurological: coordination overall normal.  Deep tendon reflex/nerve stretch intact.  Sensation normal.  Cranial nerves II-XII intact.   Skin:   normal overall no scars, lesions, ulcers or rashes. No psoriasis.  Psychiatric: Alert and oriented x  3.  Recent memory intact, remote memory unclear.  Normal mood and affect. Well groomed.  Good eye contact.  Cardiovascular: overall no swelling, no varicosities, no edema bilaterally, normal temperatures of the legs and arms, no clubbing, cyanosis and good capillary refill.  Lymphatic: palpation is normal.  Right Knee Exam  Right knee exam is normal.  Range of Motion  The patient has normal right knee ROM.  Muscle Strength   The patient has normal right knee strength.  Other  Sensation: normal Pulse: present Swelling: none   Left Knee Exam  Left knee exam is  normal.  Range of Motion  The patient has normal left knee ROM.  Muscle Strength   The patient has normal left knee strength.  Other  Sensation: normal Pulse: present Swelling: none      The patient has been educated about the nature of the problem(s) and counseled on treatment options.  The patient appeared to understand what I have discussed and is in agreement with it.  Encounter Diagnoses  Name Primary?  . Right knee pain Yes  . Left knee pain     PLAN Call if any problems.  Precautions discussed.  Continue current medications.   Return to clinic as needed/

## 2016-09-08 DIAGNOSIS — E119 Type 2 diabetes mellitus without complications: Secondary | ICD-10-CM | POA: Diagnosis not present

## 2016-09-08 DIAGNOSIS — D509 Iron deficiency anemia, unspecified: Secondary | ICD-10-CM | POA: Diagnosis not present

## 2016-09-08 DIAGNOSIS — E782 Mixed hyperlipidemia: Secondary | ICD-10-CM | POA: Diagnosis not present

## 2016-09-10 DIAGNOSIS — M25561 Pain in right knee: Secondary | ICD-10-CM | POA: Diagnosis not present

## 2016-09-10 DIAGNOSIS — M159 Polyosteoarthritis, unspecified: Secondary | ICD-10-CM | POA: Diagnosis not present

## 2016-09-10 DIAGNOSIS — E119 Type 2 diabetes mellitus without complications: Secondary | ICD-10-CM | POA: Diagnosis not present

## 2016-09-10 DIAGNOSIS — I10 Essential (primary) hypertension: Secondary | ICD-10-CM | POA: Diagnosis not present

## 2016-09-10 DIAGNOSIS — Z23 Encounter for immunization: Secondary | ICD-10-CM | POA: Diagnosis not present

## 2017-03-09 DIAGNOSIS — E119 Type 2 diabetes mellitus without complications: Secondary | ICD-10-CM | POA: Diagnosis not present

## 2017-03-09 DIAGNOSIS — D509 Iron deficiency anemia, unspecified: Secondary | ICD-10-CM | POA: Diagnosis not present

## 2017-03-09 DIAGNOSIS — Z1159 Encounter for screening for other viral diseases: Secondary | ICD-10-CM | POA: Diagnosis not present

## 2017-03-09 DIAGNOSIS — I10 Essential (primary) hypertension: Secondary | ICD-10-CM | POA: Diagnosis not present

## 2017-03-11 DIAGNOSIS — E119 Type 2 diabetes mellitus without complications: Secondary | ICD-10-CM | POA: Diagnosis not present

## 2017-03-11 DIAGNOSIS — I10 Essential (primary) hypertension: Secondary | ICD-10-CM | POA: Diagnosis not present

## 2017-03-11 DIAGNOSIS — M25561 Pain in right knee: Secondary | ICD-10-CM | POA: Diagnosis not present

## 2017-03-11 DIAGNOSIS — M25562 Pain in left knee: Secondary | ICD-10-CM | POA: Diagnosis not present

## 2017-04-15 ENCOUNTER — Other Ambulatory Visit: Payer: Self-pay | Admitting: Internal Medicine

## 2017-04-15 DIAGNOSIS — Z1231 Encounter for screening mammogram for malignant neoplasm of breast: Secondary | ICD-10-CM

## 2017-05-05 ENCOUNTER — Ambulatory Visit
Admission: RE | Admit: 2017-05-05 | Discharge: 2017-05-05 | Disposition: A | Discharge status: Home / Self Care | Payer: Medicare Other | Source: Ambulatory Visit | Attending: Internal Medicine | Admitting: Internal Medicine

## 2017-05-05 DIAGNOSIS — Z1231 Encounter for screening mammogram for malignant neoplasm of breast: Secondary | ICD-10-CM | POA: Diagnosis not present

## 2017-06-19 DIAGNOSIS — H2513 Age-related nuclear cataract, bilateral: Secondary | ICD-10-CM | POA: Diagnosis not present

## 2017-09-09 DIAGNOSIS — D509 Iron deficiency anemia, unspecified: Secondary | ICD-10-CM | POA: Diagnosis not present

## 2017-09-09 DIAGNOSIS — E119 Type 2 diabetes mellitus without complications: Secondary | ICD-10-CM | POA: Diagnosis not present

## 2017-09-09 DIAGNOSIS — I10 Essential (primary) hypertension: Secondary | ICD-10-CM | POA: Diagnosis not present

## 2017-09-11 DIAGNOSIS — Z23 Encounter for immunization: Secondary | ICD-10-CM | POA: Diagnosis not present

## 2017-09-11 DIAGNOSIS — I10 Essential (primary) hypertension: Secondary | ICD-10-CM | POA: Diagnosis not present

## 2017-09-11 DIAGNOSIS — M25562 Pain in left knee: Secondary | ICD-10-CM | POA: Diagnosis not present

## 2017-09-11 DIAGNOSIS — E119 Type 2 diabetes mellitus without complications: Secondary | ICD-10-CM | POA: Diagnosis not present

## 2017-09-11 DIAGNOSIS — M25561 Pain in right knee: Secondary | ICD-10-CM | POA: Diagnosis not present

## 2017-11-03 DIAGNOSIS — M4316 Spondylolisthesis, lumbar region: Secondary | ICD-10-CM | POA: Diagnosis not present

## 2017-11-03 DIAGNOSIS — M9902 Segmental and somatic dysfunction of thoracic region: Secondary | ICD-10-CM | POA: Diagnosis not present

## 2017-11-03 DIAGNOSIS — M9903 Segmental and somatic dysfunction of lumbar region: Secondary | ICD-10-CM | POA: Diagnosis not present

## 2017-11-03 DIAGNOSIS — M5136 Other intervertebral disc degeneration, lumbar region: Secondary | ICD-10-CM | POA: Diagnosis not present

## 2018-01-29 DIAGNOSIS — R05 Cough: Secondary | ICD-10-CM | POA: Diagnosis not present

## 2018-03-15 DIAGNOSIS — E119 Type 2 diabetes mellitus without complications: Secondary | ICD-10-CM | POA: Diagnosis not present

## 2018-03-15 DIAGNOSIS — R05 Cough: Secondary | ICD-10-CM | POA: Diagnosis not present

## 2018-03-15 DIAGNOSIS — J069 Acute upper respiratory infection, unspecified: Secondary | ICD-10-CM | POA: Diagnosis not present

## 2018-03-15 DIAGNOSIS — I10 Essential (primary) hypertension: Secondary | ICD-10-CM | POA: Diagnosis not present

## 2018-03-15 DIAGNOSIS — R062 Wheezing: Secondary | ICD-10-CM | POA: Diagnosis not present

## 2018-03-17 DIAGNOSIS — E119 Type 2 diabetes mellitus without complications: Secondary | ICD-10-CM | POA: Diagnosis not present

## 2018-03-17 DIAGNOSIS — M25562 Pain in left knee: Secondary | ICD-10-CM | POA: Diagnosis not present

## 2018-03-17 DIAGNOSIS — Z0000 Encounter for general adult medical examination without abnormal findings: Secondary | ICD-10-CM | POA: Diagnosis not present

## 2018-03-17 DIAGNOSIS — I10 Essential (primary) hypertension: Secondary | ICD-10-CM | POA: Diagnosis not present

## 2018-03-17 DIAGNOSIS — M25561 Pain in right knee: Secondary | ICD-10-CM | POA: Diagnosis not present

## 2018-03-18 ENCOUNTER — Other Ambulatory Visit (HOSPITAL_COMMUNITY): Payer: Self-pay | Admitting: Internal Medicine

## 2018-03-18 DIAGNOSIS — Z780 Asymptomatic menopausal state: Secondary | ICD-10-CM

## 2018-03-24 ENCOUNTER — Ambulatory Visit (HOSPITAL_COMMUNITY)
Admission: RE | Admit: 2018-03-24 | Discharge: 2018-03-24 | Disposition: A | Discharge status: Home / Self Care | Payer: Medicare Other | Source: Ambulatory Visit | Attending: Internal Medicine | Admitting: Internal Medicine

## 2018-03-24 DIAGNOSIS — Z13820 Encounter for screening for osteoporosis: Secondary | ICD-10-CM | POA: Diagnosis not present

## 2018-03-24 DIAGNOSIS — Z780 Asymptomatic menopausal state: Secondary | ICD-10-CM | POA: Insufficient documentation

## 2018-03-24 DIAGNOSIS — M85852 Other specified disorders of bone density and structure, left thigh: Secondary | ICD-10-CM | POA: Diagnosis not present

## 2018-04-06 DIAGNOSIS — M25552 Pain in left hip: Secondary | ICD-10-CM | POA: Diagnosis not present

## 2018-04-06 DIAGNOSIS — R600 Localized edema: Secondary | ICD-10-CM | POA: Diagnosis not present

## 2018-04-09 DIAGNOSIS — M25552 Pain in left hip: Secondary | ICD-10-CM | POA: Diagnosis not present

## 2018-04-09 DIAGNOSIS — R600 Localized edema: Secondary | ICD-10-CM | POA: Diagnosis not present

## 2018-04-30 ENCOUNTER — Other Ambulatory Visit: Payer: Self-pay | Admitting: Internal Medicine

## 2018-04-30 DIAGNOSIS — Z1231 Encounter for screening mammogram for malignant neoplasm of breast: Secondary | ICD-10-CM

## 2018-05-25 ENCOUNTER — Encounter (INDEPENDENT_AMBULATORY_CARE_PROVIDER_SITE_OTHER): Payer: Self-pay

## 2018-05-25 ENCOUNTER — Ambulatory Visit: Payer: Medicare Other | Admitting: Orthopaedic Surgery

## 2018-05-25 ENCOUNTER — Encounter: Payer: Self-pay | Admitting: Orthopaedic Surgery

## 2018-05-25 ENCOUNTER — Ambulatory Visit (INDEPENDENT_AMBULATORY_CARE_PROVIDER_SITE_OTHER): Payer: Medicare Other

## 2018-05-25 VITALS — BP 139/78 | HR 74 | Ht 60.0 in | Wt 161.0 lb

## 2018-05-25 DIAGNOSIS — G8929 Other chronic pain: Secondary | ICD-10-CM

## 2018-05-25 DIAGNOSIS — M25562 Pain in left knee: Secondary | ICD-10-CM

## 2018-05-25 DIAGNOSIS — M25561 Pain in right knee: Secondary | ICD-10-CM

## 2018-05-25 NOTE — Progress Notes (Signed)
CC: Both of my knees are hurting. I would like an injection in both knees.  The patient has had chronic pain and tenderness of both knees for some time.  Injections help.  There is no locking or giving way of the knee.  There is no new trauma. There is no redness or signs of infections.  The knees have a mild effusion and some crepitus.  There is no redness or signs of recent trauma.  Right knee ROM is 0-100 and left knee ROM is 0-110.  Impression:  Chronic pain of the both knees  Return:  prn  PROCEDURE NOTE:  The patient requests injections of both knees, verbal consent was obtained.  The left and right knee were individually prepped appropriately after time out was performed.   Sterile technique was observed and injection of 1 cc of Depo-Medrol 40 mg with several cc's of plain xylocaine. Anesthesia was provided by ethyl chloride and a 20-gauge needle was used to inject each knee area. The injections were tolerated well.  A band aid dressing was applied.  The patient was advised to apply ice later today and tomorrow to the injection sight as needed.  [facility] which I [facility].  X-rays were done of the left knee, reported separately.  Electronically Signed Darreld McleanWayne Truly Stankiewicz, MD 6/11/20199:52 AM

## 2018-05-28 ENCOUNTER — Ambulatory Visit
Admission: RE | Admit: 2018-05-28 | Discharge: 2018-05-28 | Disposition: A | Discharge status: Home / Self Care | Payer: Medicare Other | Source: Ambulatory Visit | Attending: Internal Medicine | Admitting: Internal Medicine

## 2018-05-28 DIAGNOSIS — Z1231 Encounter for screening mammogram for malignant neoplasm of breast: Secondary | ICD-10-CM

## 2018-06-24 DIAGNOSIS — H05011 Cellulitis of right orbit: Secondary | ICD-10-CM | POA: Diagnosis not present

## 2018-06-24 DIAGNOSIS — S00261A Insect bite (nonvenomous) of right eyelid and periocular area, initial encounter: Secondary | ICD-10-CM | POA: Diagnosis not present

## 2018-08-30 DIAGNOSIS — H2513 Age-related nuclear cataract, bilateral: Secondary | ICD-10-CM | POA: Diagnosis not present

## 2018-09-13 DIAGNOSIS — I10 Essential (primary) hypertension: Secondary | ICD-10-CM | POA: Diagnosis not present

## 2018-09-13 DIAGNOSIS — E119 Type 2 diabetes mellitus without complications: Secondary | ICD-10-CM | POA: Diagnosis not present

## 2018-09-15 DIAGNOSIS — R600 Localized edema: Secondary | ICD-10-CM | POA: Diagnosis not present

## 2018-09-15 DIAGNOSIS — Z23 Encounter for immunization: Secondary | ICD-10-CM | POA: Diagnosis not present

## 2018-09-15 DIAGNOSIS — I10 Essential (primary) hypertension: Secondary | ICD-10-CM | POA: Diagnosis not present

## 2018-09-15 DIAGNOSIS — M170 Bilateral primary osteoarthritis of knee: Secondary | ICD-10-CM | POA: Diagnosis not present

## 2018-09-15 DIAGNOSIS — E1169 Type 2 diabetes mellitus with other specified complication: Secondary | ICD-10-CM | POA: Diagnosis not present

## 2018-09-16 DIAGNOSIS — M9902 Segmental and somatic dysfunction of thoracic region: Secondary | ICD-10-CM | POA: Diagnosis not present

## 2018-09-16 DIAGNOSIS — M4316 Spondylolisthesis, lumbar region: Secondary | ICD-10-CM | POA: Diagnosis not present

## 2018-09-16 DIAGNOSIS — M9903 Segmental and somatic dysfunction of lumbar region: Secondary | ICD-10-CM | POA: Diagnosis not present

## 2018-09-16 DIAGNOSIS — M5136 Other intervertebral disc degeneration, lumbar region: Secondary | ICD-10-CM | POA: Diagnosis not present

## 2018-09-27 IMAGING — MG DIGITAL SCREENING BILATERAL MAMMOGRAM WITH TOMO AND CAD
8 series · 8 of 24 positions shown · non-contrast
Comparison: Previous exam(s).

CLINICAL DATA: Screening.

EXAM:
DIGITAL SCREENING BILATERAL MAMMOGRAM WITH TOMO AND CAD

[R MLO synth-2D]
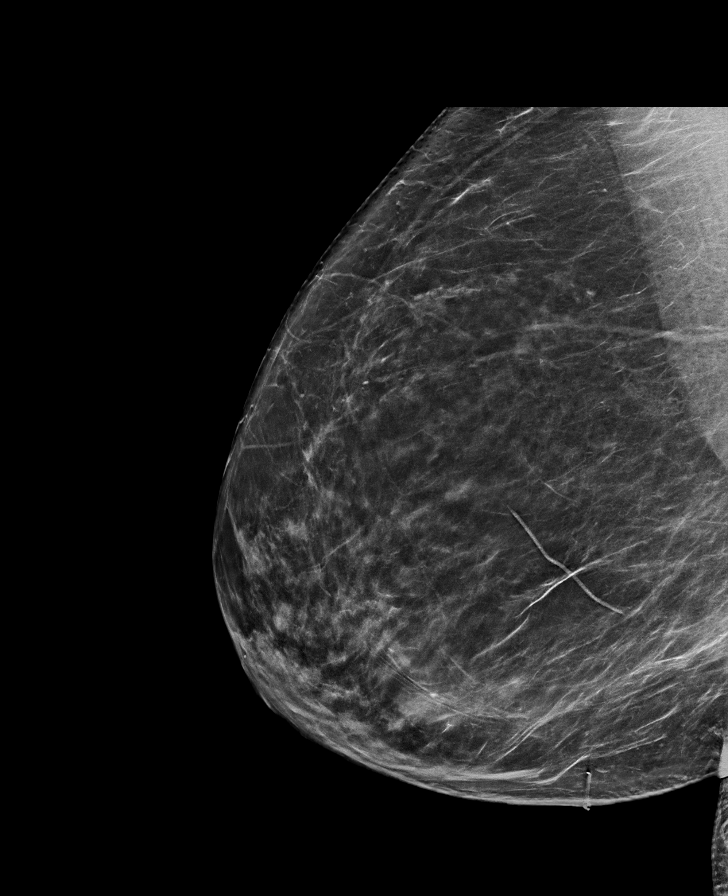

[L MLO synth-2D]
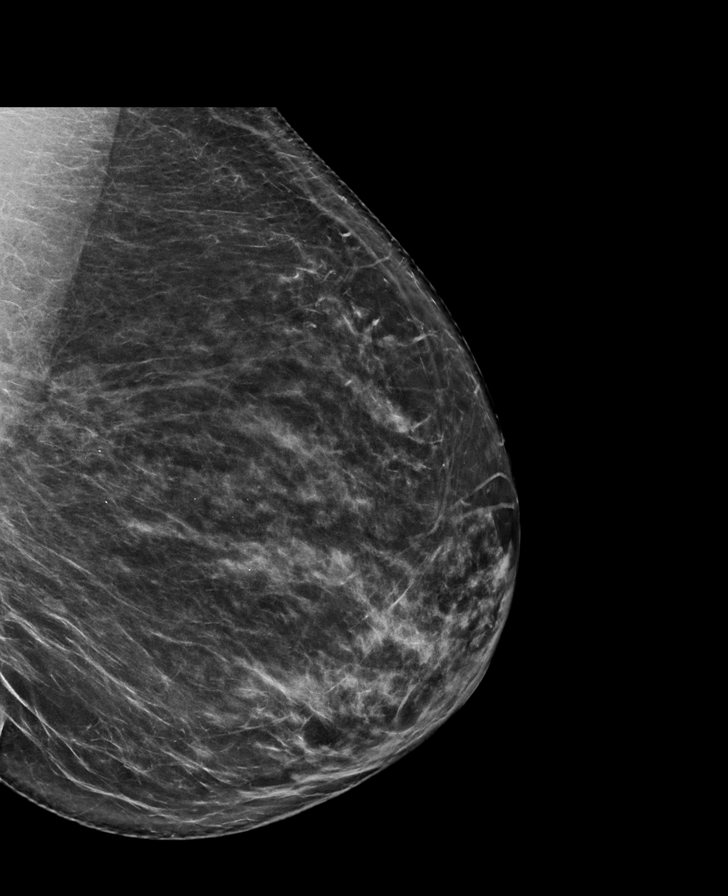

[R CC synth-2D]
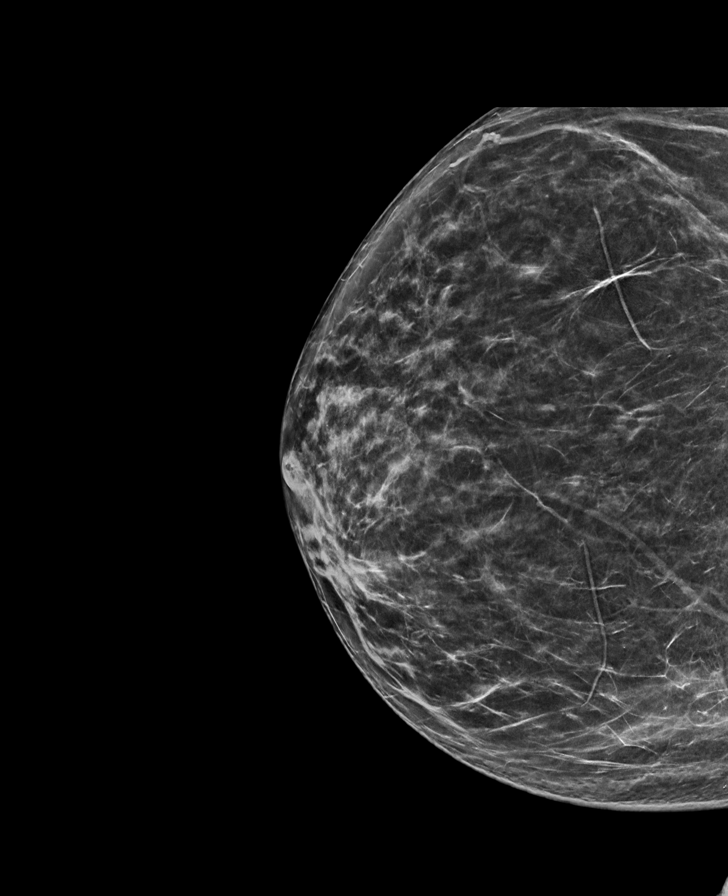

[L CC synth-2D]
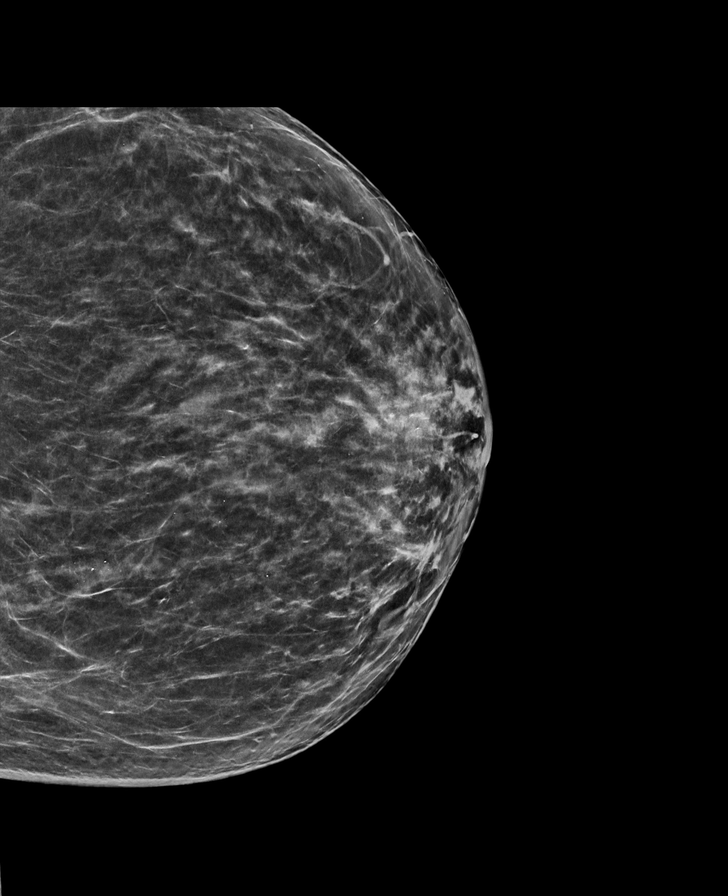

[R CC tomo · tomo slice 40/79.0]
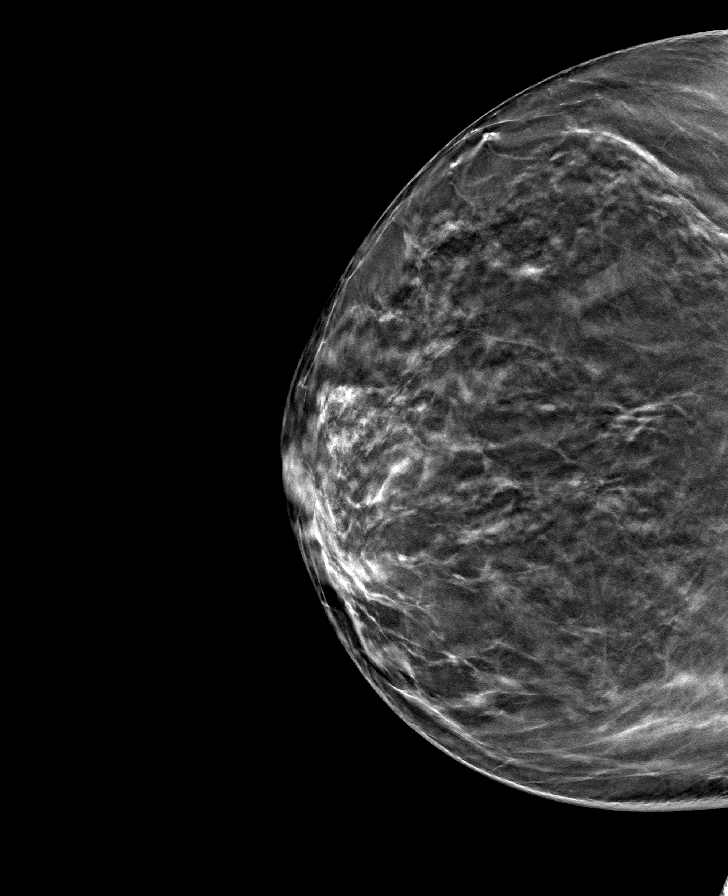

[L MLO tomo · tomo slice 45/89.0]
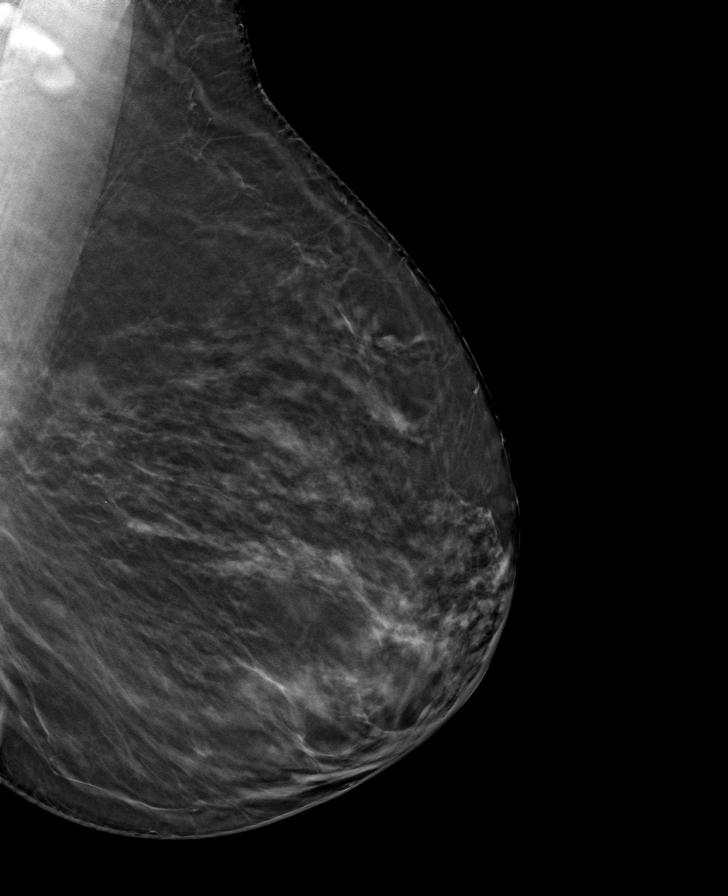

[R MLO tomo · tomo slice 44/87.0]
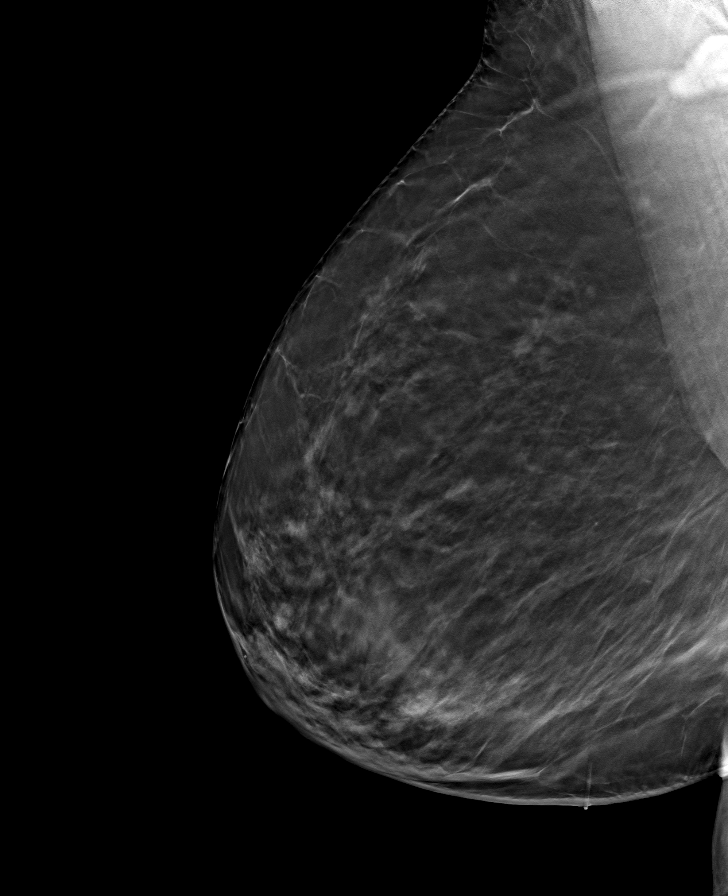

[L CC tomo · tomo slice 41/80.0]
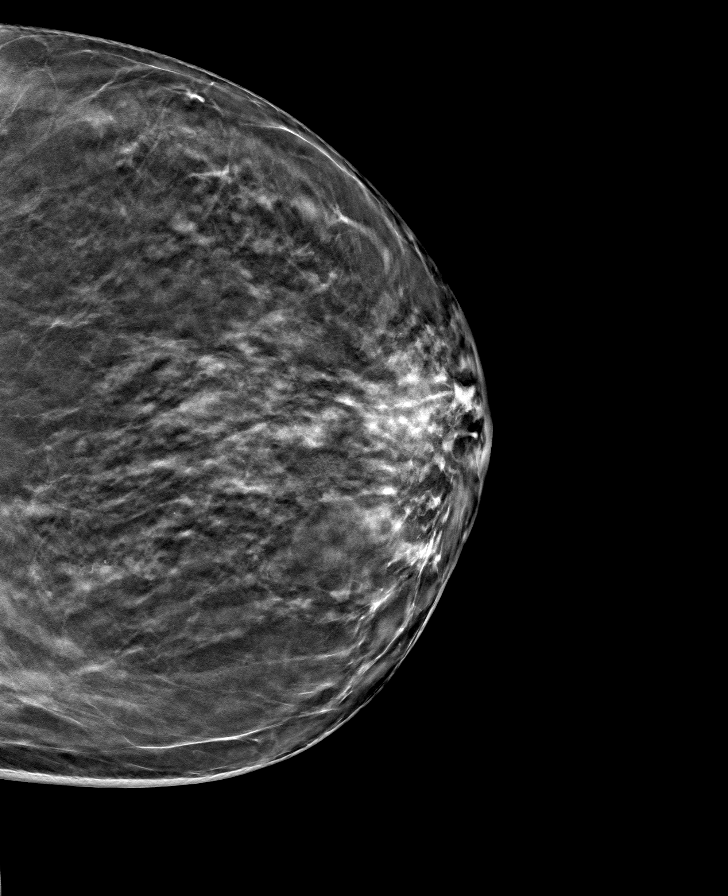

[8 of 24 positions shown; findings below may reference images not displayed]

ACR Breast Density Category c: The breast tissue is heterogeneously
dense, which may obscure small masses.
FINDINGS: There are no findings suspicious for malignancy. Images were
processed with CAD.
IMPRESSION: No mammographic evidence of malignancy. A result letter of this
screening mammogram will be mailed directly to the patient.

RECOMMENDATION:
Screening mammogram in one year. (Code:FT-U-LHB)

BI-RADS CATEGORY  1: Negative.

## 2018-10-13 DIAGNOSIS — M4316 Spondylolisthesis, lumbar region: Secondary | ICD-10-CM | POA: Diagnosis not present

## 2018-10-13 DIAGNOSIS — M9902 Segmental and somatic dysfunction of thoracic region: Secondary | ICD-10-CM | POA: Diagnosis not present

## 2018-10-13 DIAGNOSIS — M5136 Other intervertebral disc degeneration, lumbar region: Secondary | ICD-10-CM | POA: Diagnosis not present

## 2018-10-13 DIAGNOSIS — M9903 Segmental and somatic dysfunction of lumbar region: Secondary | ICD-10-CM | POA: Diagnosis not present

## 2018-10-29 DIAGNOSIS — M4316 Spondylolisthesis, lumbar region: Secondary | ICD-10-CM | POA: Diagnosis not present

## 2018-10-29 DIAGNOSIS — M5136 Other intervertebral disc degeneration, lumbar region: Secondary | ICD-10-CM | POA: Diagnosis not present

## 2018-10-29 DIAGNOSIS — M9903 Segmental and somatic dysfunction of lumbar region: Secondary | ICD-10-CM | POA: Diagnosis not present

## 2018-10-29 DIAGNOSIS — M9902 Segmental and somatic dysfunction of thoracic region: Secondary | ICD-10-CM | POA: Diagnosis not present

## 2018-11-15 DIAGNOSIS — M4316 Spondylolisthesis, lumbar region: Secondary | ICD-10-CM | POA: Diagnosis not present

## 2018-11-15 DIAGNOSIS — M5136 Other intervertebral disc degeneration, lumbar region: Secondary | ICD-10-CM | POA: Diagnosis not present

## 2018-11-15 DIAGNOSIS — M9902 Segmental and somatic dysfunction of thoracic region: Secondary | ICD-10-CM | POA: Diagnosis not present

## 2018-11-15 DIAGNOSIS — M9903 Segmental and somatic dysfunction of lumbar region: Secondary | ICD-10-CM | POA: Diagnosis not present

## 2018-11-29 DIAGNOSIS — M4316 Spondylolisthesis, lumbar region: Secondary | ICD-10-CM | POA: Diagnosis not present

## 2018-11-29 DIAGNOSIS — M9903 Segmental and somatic dysfunction of lumbar region: Secondary | ICD-10-CM | POA: Diagnosis not present

## 2018-11-29 DIAGNOSIS — M5136 Other intervertebral disc degeneration, lumbar region: Secondary | ICD-10-CM | POA: Diagnosis not present

## 2018-11-29 DIAGNOSIS — M9902 Segmental and somatic dysfunction of thoracic region: Secondary | ICD-10-CM | POA: Diagnosis not present

## 2018-12-29 DIAGNOSIS — R69 Illness, unspecified: Secondary | ICD-10-CM | POA: Diagnosis not present

## 2019-02-14 DIAGNOSIS — M170 Bilateral primary osteoarthritis of knee: Secondary | ICD-10-CM | POA: Diagnosis not present

## 2019-02-15 DIAGNOSIS — M545 Low back pain: Secondary | ICD-10-CM | POA: Diagnosis not present

## 2019-02-15 DIAGNOSIS — M170 Bilateral primary osteoarthritis of knee: Secondary | ICD-10-CM | POA: Diagnosis not present

## 2019-02-15 DIAGNOSIS — E119 Type 2 diabetes mellitus without complications: Secondary | ICD-10-CM | POA: Diagnosis not present

## 2019-02-15 DIAGNOSIS — R600 Localized edema: Secondary | ICD-10-CM | POA: Diagnosis not present

## 2019-02-15 DIAGNOSIS — I10 Essential (primary) hypertension: Secondary | ICD-10-CM | POA: Diagnosis not present

## 2019-02-16 DIAGNOSIS — Z0000 Encounter for general adult medical examination without abnormal findings: Secondary | ICD-10-CM | POA: Diagnosis not present

## 2019-02-16 DIAGNOSIS — E119 Type 2 diabetes mellitus without complications: Secondary | ICD-10-CM | POA: Diagnosis not present

## 2019-02-16 DIAGNOSIS — I10 Essential (primary) hypertension: Secondary | ICD-10-CM | POA: Diagnosis not present

## 2019-02-16 DIAGNOSIS — Z23 Encounter for immunization: Secondary | ICD-10-CM | POA: Diagnosis not present

## 2019-02-16 DIAGNOSIS — M25561 Pain in right knee: Secondary | ICD-10-CM | POA: Diagnosis not present

## 2019-02-17 DIAGNOSIS — I10 Essential (primary) hypertension: Secondary | ICD-10-CM | POA: Diagnosis not present

## 2019-02-17 DIAGNOSIS — M25561 Pain in right knee: Secondary | ICD-10-CM | POA: Diagnosis not present

## 2019-02-17 DIAGNOSIS — M170 Bilateral primary osteoarthritis of knee: Secondary | ICD-10-CM | POA: Diagnosis not present

## 2019-02-17 DIAGNOSIS — E1165 Type 2 diabetes mellitus with hyperglycemia: Secondary | ICD-10-CM | POA: Diagnosis not present

## 2019-02-17 DIAGNOSIS — M25562 Pain in left knee: Secondary | ICD-10-CM | POA: Diagnosis not present

## 2019-04-19 DIAGNOSIS — Z0000 Encounter for general adult medical examination without abnormal findings: Secondary | ICD-10-CM | POA: Diagnosis not present

## 2019-05-26 DIAGNOSIS — E1169 Type 2 diabetes mellitus with other specified complication: Secondary | ICD-10-CM | POA: Diagnosis not present

## 2019-05-26 DIAGNOSIS — E1165 Type 2 diabetes mellitus with hyperglycemia: Secondary | ICD-10-CM | POA: Diagnosis not present

## 2019-05-26 DIAGNOSIS — I10 Essential (primary) hypertension: Secondary | ICD-10-CM | POA: Diagnosis not present

## 2019-05-26 DIAGNOSIS — E119 Type 2 diabetes mellitus without complications: Secondary | ICD-10-CM | POA: Diagnosis not present

## 2019-07-25 DIAGNOSIS — E119 Type 2 diabetes mellitus without complications: Secondary | ICD-10-CM | POA: Diagnosis not present

## 2019-08-29 DIAGNOSIS — E119 Type 2 diabetes mellitus without complications: Secondary | ICD-10-CM | POA: Diagnosis not present

## 2019-08-29 DIAGNOSIS — I10 Essential (primary) hypertension: Secondary | ICD-10-CM | POA: Diagnosis not present

## 2019-08-29 DIAGNOSIS — E1165 Type 2 diabetes mellitus with hyperglycemia: Secondary | ICD-10-CM | POA: Diagnosis not present

## 2019-08-29 DIAGNOSIS — E1169 Type 2 diabetes mellitus with other specified complication: Secondary | ICD-10-CM | POA: Diagnosis not present

## 2019-09-02 DIAGNOSIS — I10 Essential (primary) hypertension: Secondary | ICD-10-CM | POA: Diagnosis not present

## 2019-09-02 DIAGNOSIS — E1165 Type 2 diabetes mellitus with hyperglycemia: Secondary | ICD-10-CM | POA: Diagnosis not present

## 2019-09-02 DIAGNOSIS — E119 Type 2 diabetes mellitus without complications: Secondary | ICD-10-CM | POA: Diagnosis not present

## 2019-09-02 DIAGNOSIS — E1169 Type 2 diabetes mellitus with other specified complication: Secondary | ICD-10-CM | POA: Diagnosis not present

## 2019-09-05 DIAGNOSIS — M170 Bilateral primary osteoarthritis of knee: Secondary | ICD-10-CM | POA: Diagnosis not present

## 2019-09-05 DIAGNOSIS — I10 Essential (primary) hypertension: Secondary | ICD-10-CM | POA: Diagnosis not present

## 2019-09-05 DIAGNOSIS — E1165 Type 2 diabetes mellitus with hyperglycemia: Secondary | ICD-10-CM | POA: Diagnosis not present

## 2019-09-05 DIAGNOSIS — R600 Localized edema: Secondary | ICD-10-CM | POA: Diagnosis not present

## 2019-09-05 DIAGNOSIS — M25561 Pain in right knee: Secondary | ICD-10-CM | POA: Diagnosis not present

## 2019-10-03 DIAGNOSIS — I10 Essential (primary) hypertension: Secondary | ICD-10-CM | POA: Diagnosis not present

## 2019-10-03 DIAGNOSIS — E1169 Type 2 diabetes mellitus with other specified complication: Secondary | ICD-10-CM | POA: Diagnosis not present

## 2019-10-03 DIAGNOSIS — E119 Type 2 diabetes mellitus without complications: Secondary | ICD-10-CM | POA: Diagnosis not present

## 2019-10-03 DIAGNOSIS — E1165 Type 2 diabetes mellitus with hyperglycemia: Secondary | ICD-10-CM | POA: Diagnosis not present

## 2019-11-01 DIAGNOSIS — I10 Essential (primary) hypertension: Secondary | ICD-10-CM | POA: Diagnosis not present

## 2019-11-01 DIAGNOSIS — E1165 Type 2 diabetes mellitus with hyperglycemia: Secondary | ICD-10-CM | POA: Diagnosis not present

## 2019-12-20 DIAGNOSIS — E1165 Type 2 diabetes mellitus with hyperglycemia: Secondary | ICD-10-CM | POA: Diagnosis not present

## 2019-12-20 DIAGNOSIS — I10 Essential (primary) hypertension: Secondary | ICD-10-CM | POA: Diagnosis not present

## 2020-03-07 DIAGNOSIS — E1165 Type 2 diabetes mellitus with hyperglycemia: Secondary | ICD-10-CM | POA: Diagnosis not present

## 2020-03-07 DIAGNOSIS — E1169 Type 2 diabetes mellitus with other specified complication: Secondary | ICD-10-CM | POA: Diagnosis not present

## 2020-03-07 DIAGNOSIS — I10 Essential (primary) hypertension: Secondary | ICD-10-CM | POA: Diagnosis not present

## 2020-03-07 DIAGNOSIS — E119 Type 2 diabetes mellitus without complications: Secondary | ICD-10-CM | POA: Diagnosis not present

## 2020-03-12 DIAGNOSIS — I10 Essential (primary) hypertension: Secondary | ICD-10-CM | POA: Diagnosis not present

## 2020-03-12 DIAGNOSIS — R600 Localized edema: Secondary | ICD-10-CM | POA: Diagnosis not present

## 2020-03-12 DIAGNOSIS — E1169 Type 2 diabetes mellitus with other specified complication: Secondary | ICD-10-CM | POA: Diagnosis not present

## 2020-03-12 DIAGNOSIS — Z0001 Encounter for general adult medical examination with abnormal findings: Secondary | ICD-10-CM | POA: Diagnosis not present

## 2020-03-12 DIAGNOSIS — E1165 Type 2 diabetes mellitus with hyperglycemia: Secondary | ICD-10-CM | POA: Diagnosis not present

## 2020-03-12 DIAGNOSIS — M25561 Pain in right knee: Secondary | ICD-10-CM | POA: Diagnosis not present

## 2020-03-12 DIAGNOSIS — M170 Bilateral primary osteoarthritis of knee: Secondary | ICD-10-CM | POA: Diagnosis not present

## 2020-05-21 DIAGNOSIS — M170 Bilateral primary osteoarthritis of knee: Secondary | ICD-10-CM | POA: Diagnosis not present

## 2020-05-28 DIAGNOSIS — M25562 Pain in left knee: Secondary | ICD-10-CM | POA: Diagnosis not present

## 2020-06-19 DIAGNOSIS — M1712 Unilateral primary osteoarthritis, left knee: Secondary | ICD-10-CM | POA: Diagnosis not present

## 2020-06-20 ENCOUNTER — Other Ambulatory Visit: Payer: Self-pay | Admitting: Orthopedic Surgery

## 2020-06-20 DIAGNOSIS — M25562 Pain in left knee: Secondary | ICD-10-CM

## 2020-06-22 DIAGNOSIS — I10 Essential (primary) hypertension: Secondary | ICD-10-CM | POA: Diagnosis not present

## 2020-06-22 DIAGNOSIS — E1165 Type 2 diabetes mellitus with hyperglycemia: Secondary | ICD-10-CM | POA: Diagnosis not present

## 2020-06-27 ENCOUNTER — Other Ambulatory Visit: Payer: Self-pay

## 2020-06-27 ENCOUNTER — Ambulatory Visit (HOSPITAL_COMMUNITY)
Admission: RE | Admit: 2020-06-27 | Discharge: 2020-06-27 | Disposition: A | Discharge status: Home / Self Care | Payer: Medicare Other | Source: Ambulatory Visit | Attending: Orthopedic Surgery | Admitting: Orthopedic Surgery

## 2020-06-27 DIAGNOSIS — M8548 Solitary bone cyst, other site: Secondary | ICD-10-CM | POA: Diagnosis not present

## 2020-06-27 DIAGNOSIS — M25562 Pain in left knee: Secondary | ICD-10-CM | POA: Diagnosis not present

## 2020-06-27 DIAGNOSIS — M1712 Unilateral primary osteoarthritis, left knee: Secondary | ICD-10-CM | POA: Diagnosis not present

## 2020-06-27 DIAGNOSIS — M898X6 Other specified disorders of bone, lower leg: Secondary | ICD-10-CM | POA: Diagnosis not present

## 2020-07-13 ENCOUNTER — Other Ambulatory Visit: Payer: Self-pay | Admitting: Internal Medicine

## 2020-07-13 DIAGNOSIS — Z1231 Encounter for screening mammogram for malignant neoplasm of breast: Secondary | ICD-10-CM

## 2020-07-25 ENCOUNTER — Ambulatory Visit
Admission: RE | Admit: 2020-07-25 | Discharge: 2020-07-25 | Disposition: A | Discharge status: Home / Self Care | Payer: Medicare Other | Source: Ambulatory Visit | Attending: Internal Medicine | Admitting: Internal Medicine

## 2020-07-25 ENCOUNTER — Other Ambulatory Visit: Payer: Self-pay

## 2020-07-25 DIAGNOSIS — Z1231 Encounter for screening mammogram for malignant neoplasm of breast: Secondary | ICD-10-CM | POA: Diagnosis not present

## 2020-08-07 DIAGNOSIS — R6889 Other general symptoms and signs: Secondary | ICD-10-CM | POA: Diagnosis not present

## 2020-08-22 ENCOUNTER — Other Ambulatory Visit: Payer: Self-pay | Admitting: Orthopedic Surgery

## 2020-09-06 DIAGNOSIS — I10 Essential (primary) hypertension: Secondary | ICD-10-CM | POA: Diagnosis not present

## 2020-09-06 DIAGNOSIS — E1165 Type 2 diabetes mellitus with hyperglycemia: Secondary | ICD-10-CM | POA: Diagnosis not present

## 2020-09-07 DIAGNOSIS — E1165 Type 2 diabetes mellitus with hyperglycemia: Secondary | ICD-10-CM | POA: Diagnosis not present

## 2020-09-07 DIAGNOSIS — Z0000 Encounter for general adult medical examination without abnormal findings: Secondary | ICD-10-CM | POA: Diagnosis not present

## 2020-09-07 DIAGNOSIS — G4700 Insomnia, unspecified: Secondary | ICD-10-CM | POA: Diagnosis not present

## 2020-09-07 DIAGNOSIS — I10 Essential (primary) hypertension: Secondary | ICD-10-CM | POA: Diagnosis not present

## 2020-09-07 DIAGNOSIS — Z23 Encounter for immunization: Secondary | ICD-10-CM | POA: Diagnosis not present

## 2020-09-11 ENCOUNTER — Encounter: Payer: Self-pay | Admitting: Cardiology

## 2020-09-11 DIAGNOSIS — E1165 Type 2 diabetes mellitus with hyperglycemia: Secondary | ICD-10-CM | POA: Diagnosis not present

## 2020-09-11 DIAGNOSIS — M170 Bilateral primary osteoarthritis of knee: Secondary | ICD-10-CM | POA: Diagnosis not present

## 2020-09-11 DIAGNOSIS — R600 Localized edema: Secondary | ICD-10-CM | POA: Diagnosis not present

## 2020-09-11 DIAGNOSIS — I10 Essential (primary) hypertension: Secondary | ICD-10-CM | POA: Diagnosis not present

## 2020-09-11 DIAGNOSIS — M25561 Pain in right knee: Secondary | ICD-10-CM | POA: Diagnosis not present

## 2020-09-11 NOTE — Patient Instructions (Addendum)
DUE TO COVID-19 ONLY ONE VISITOR IS ALLOWED TO COME WITH YOU AND STAY IN THE WAITING ROOM ONLY DURING PRE OP AND PROCEDURE DAY OF SURGERY. THE 1 VISITOR  MAY VISIT WITH YOU AFTER SURGERY IN YOUR PRIVATE ROOM DURING VISITING HOURS ONLY!  YOU NEED TO HAVE A COVID 19 TEST ON: 09/20/20 @ 11:00 AM, THIS TEST MUST BE DONE BEFORE SURGERY,  COVID TESTING SITE [address] [place] [address], IT IS ON THE RIGHT GOING OUT [address] APPROXIMATELY  2 MINUTES PAST [employer] ON THE RIGHT. ONCE YOUR COVID TEST IS COMPLETED,  PLEASE BEGIN THE QUARANTINE INSTRUCTIONS AS OUTLINED IN YOUR HANDOUT.                [name]    Your procedure is scheduled on: 09/24/20   Report to [facility] Main  Entrance   Report to admitting at: 8:00 AM     Call this number if you have problems the morning of surgery [phone]    Remember:   NO SOLID FOOD AFTER MIDNIGHT THE NIGHT PRIOR TO SURGERY. NOTHING BY MOUTH EXCEPT CLEAR LIQUIDS UNTIL: 7:30 AM . PLEASE FINISH ENSURE DRINK PER SURGEON ORDER  WHICH NEEDS TO BE COMPLETED AT: 7:30 AM .  CLEAR LIQUID DIET   Foods Allowed                                                                     Foods Excluded  Coffee and tea, regular and decaf                             liquids that you cannot  Plain Jell-O any favor except red or purple                                           see through such as: Fruit ices (not with fruit pulp)                                     milk, soups, orange juice  Iced Popsicles                                    All solid food Carbonated beverages, regular and diet                                    Cranberry, grape and apple juices Sports drinks like Gatorade Lightly seasoned clear broth or consume(fat free) Sugar, honey syrup  Sample Menu Breakfast                                Lunch  Supper Cranberry juice                    Beef broth                             Chicken broth Jell-O                                     Grape juice                           Apple juice Coffee or tea                        Jell-O                                      Popsicle                                                Coffee or tea                        Coffee or tea  _____________________________________________________________________   BRUSH YOUR TEETH MORNING OF SURGERY AND RINSE YOUR MOUTH OUT, NO CHEWING GUM CANDY OR MINTS.     Take these medicines the morning of surgery with A SIP OF WATER: amlodipine,metoprolol.                               You may not have any metal on your body including hair pins and              piercings  Do not wear jewelry, make-up, lotions, powders or perfumes, deodorant             Do not wear nail polish on your fingernails.  Do not shave  48 hours prior to surgery.           Do not bring valuables to the hospital. [facility] IS NOT             RESPONSIBLE   FOR VALUABLES.  Contacts, dentures or bridgework may not be worn into surgery.  Leave suitcase in the car. After surgery it may be brought to your room.     Patients discharged the day of surgery will not be allowed to drive home. IF YOU ARE HAVING SURGERY AND GOING HOME THE SAME DAY, YOU MUST HAVE AN ADULT TO DRIVE YOU HOME AND BE WITH YOU FOR 24 HOURS. YOU MAY GO HOME BY TAXI OR UBER OR ORTHERWISE, BUT AN ADULT MUST ACCOMPANY YOU HOME AND STAY WITH YOU FOR 24 HOURS.  Name and phone number of your driver:  Special Instructions: N/A              Please read over the following fact sheets you were given: _____________________________________________________________________      [facility] - Preparing for Surgery Before surgery, you can play an important role.  Because skin is not sterile, your skin needs to be as free of germs  as possible.  You can reduce the number of germs on your skin by washing with CHG (chlorahexidine gluconate) soap before surgery.   CHG is an antiseptic cleaner which kills germs and bonds with the skin to continue killing germs even after washing. Please DO NOT use if you have an allergy to CHG or antibacterial soaps.  If your skin becomes reddened/irritated stop using the CHG and inform your nurse when you arrive at Short Stay. Do not shave (including legs and underarms) for at least 48 hours prior to the first CHG shower.  You may shave your face/neck. Please follow these instructions carefully:  1.  Shower with CHG Soap the night before surgery and the  morning of Surgery.  2.  If you choose to wash your hair, wash your hair first as usual with your  normal  shampoo.  3.  After you shampoo, rinse your hair and body thoroughly to remove the  shampoo.                           4.  Use CHG as you would any other liquid soap.  You can apply chg directly  to the skin and wash                       Gently with a scrungie or clean washcloth.  5.  Apply the CHG Soap to your body ONLY FROM THE NECK DOWN.   Do not use on face/ open                           Wound or open sores. Avoid contact with eyes, ears mouth and genitals (private parts).                       Wash face,  Genitals (private parts) with your normal soap.             6.  Wash thoroughly, paying special attention to the area where your surgery  will be performed.  7.  Thoroughly rinse your body with warm water from the neck down.  8.  DO NOT shower/wash with your normal soap after using and rinsing off  the CHG Soap.                9.  Pat yourself dry with a clean towel.            10.  Wear clean pajamas.            11.  Place clean sheets on your bed the night of your first shower and do not  sleep with pets. Day of Surgery : Do not apply any lotions/deodorants the morning of surgery.  Please wear clean clothes to the hospital/surgery center.  FAILURE TO FOLLOW THESE INSTRUCTIONS MAY RESULT IN THE CANCELLATION OF YOUR SURGERY PATIENT  SIGNATURE_________________________________  NURSE SIGNATURE__________________________________  ________________________________________________________________________   [name]  An incentive spirometer is a tool that can help keep your lungs clear and active. This tool measures how well you are filling your lungs with each breath. Taking long deep breaths may help reverse or decrease the chance of developing breathing (pulmonary) problems (especially infection) following:  A long period of time when you are unable to move or be active. BEFORE THE PROCEDURE   If the spirometer includes an indicator to show your best effort, your nurse or  respiratory therapist will set it to a desired goal.  If possible, sit up straight or lean slightly forward. Try not to slouch.  Hold the incentive spirometer in an upright position. INSTRUCTIONS FOR USE  1. Sit on the edge of your bed if possible, or sit up as far as you can in bed or on a chair. 2. Hold the incentive spirometer in an upright position. 3. Breathe out normally. 4. Place the mouthpiece in your mouth and seal your lips tightly around it. 5. Breathe in slowly and as deeply as possible, raising the piston or the ball toward the top of the column. 6. Hold your breath for 3-5 seconds or for as long as possible. Allow the piston or ball to fall to the bottom of the column. 7. Remove the mouthpiece from your mouth and breathe out normally. 8. Rest for a few seconds and repeat Steps 1 through 7 at least 10 times every 1-2 hours when you are awake. Take your time and take a few normal breaths between deep breaths. 9. The spirometer may include an indicator to show your best effort. Use the indicator as a goal to work toward during each repetition. 10. After each set of 10 deep breaths, practice coughing to be sure your lungs are clear. If you have an incision (the cut made at the time of surgery), support your incision when coughing  by placing a pillow or rolled up towels firmly against it. Once you are able to get out of bed, walk around indoors and cough well. You may stop using the incentive spirometer when instructed by your caregiver.  RISKS AND COMPLICATIONS  Take your time so you do not get dizzy or light-headed.  If you are in pain, you may need to take or ask for pain medication before doing incentive spirometry. It is harder to take a deep breath if you are having pain. AFTER USE  Rest and breathe slowly and easily.  It can be helpful to keep track of a log of your progress. Your caregiver can provide you with a simple table to help with this. If you are using the spirometer at home, follow these instructions: [place] IF:   You are having difficultly using the spirometer.  You have trouble using the spirometer as often as instructed.  Your pain medication is not giving enough relief while using the spirometer.  You develop fever of 100.5 F (38.1 C) or higher. SEEK IMMEDIATE MEDICAL CARE IF:   You cough up bloody sputum that had not been present before.  You develop fever of 102 F (38.9 C) or greater.  You develop worsening pain at or near the incision site. MAKE SURE YOU:   Understand these instructions.  Will watch your condition.  Will get help right away if you are not doing well or get worse. Document Released: 04/13/2007 Document Revised: 02/23/2012 Document Reviewed: 06/14/2007 [facility] Patient Information 2014 [place], [place].   ________________________________________________________________________

## 2020-09-12 ENCOUNTER — Encounter (HOSPITAL_COMMUNITY): Payer: Self-pay

## 2020-09-12 ENCOUNTER — Ambulatory Visit (HOSPITAL_COMMUNITY)
Admission: RE | Admit: 2020-09-12 | Discharge: 2020-09-12 | Disposition: A | Discharge status: Home / Self Care | Payer: Medicare Other | Source: Ambulatory Visit | Attending: Orthopedic Surgery | Admitting: Orthopedic Surgery

## 2020-09-12 ENCOUNTER — Other Ambulatory Visit: Payer: Self-pay

## 2020-09-12 ENCOUNTER — Encounter (HOSPITAL_COMMUNITY)
Admission: RE | Admit: 2020-09-12 | Discharge: 2020-09-12 | Disposition: A | Discharge status: Home / Self Care | Payer: Medicare Other | Source: Ambulatory Visit | Attending: Orthopedic Surgery | Admitting: Orthopedic Surgery

## 2020-09-12 DIAGNOSIS — J811 Chronic pulmonary edema: Secondary | ICD-10-CM | POA: Diagnosis not present

## 2020-09-12 DIAGNOSIS — Z01818 Encounter for other preprocedural examination: Secondary | ICD-10-CM

## 2020-09-12 HISTORY — DX: Prediabetes: R73.03

## 2020-09-12 HISTORY — DX: Unspecified osteoarthritis, unspecified site: M19.90

## 2020-09-12 HISTORY — DX: Anemia, unspecified: D64.9

## 2020-09-12 LAB — URINALYSIS, ROUTINE W REFLEX MICROSCOPIC
Bilirubin Urine: NEGATIVE
Glucose, UA: NEGATIVE mg/dL
Hgb urine dipstick: NEGATIVE
Ketones, ur: NEGATIVE mg/dL
Leukocytes,Ua: NEGATIVE
Nitrite: NEGATIVE
Protein, ur: NEGATIVE mg/dL
Specific Gravity, Urine: 1.016 (ref 1.005–1.030)
pH: 6 (ref 5.0–8.0)

## 2020-09-12 LAB — BASIC METABOLIC PANEL
Anion gap: 9 (ref 5–15)
BUN: 15 mg/dL (ref 8–23)
CO2: 28 mmol/L (ref 22–32)
Calcium: 9.9 mg/dL (ref 8.9–10.3)
Chloride: 102 mmol/L (ref 98–111)
Creatinine, Ser: 0.81 mg/dL (ref 0.44–1.00)
GFR calc Af Amer: >60 mL/min (ref >60)
GFR calc non Af Amer: >60 mL/min (ref >60)
Glucose, Bld: 110 mg/dL — ABNORMAL HIGH (ref 70–99)
Potassium: 4.4 mmol/L (ref 3.5–5.1)
Sodium: 139 mmol/L (ref 135–145)

## 2020-09-12 LAB — CBC WITH DIFFERENTIAL/PLATELET
Abs Immature Granulocytes: 0.01 10*3/uL (ref 0.00–0.07)
Basophils Absolute: 0 10*3/uL (ref 0.0–0.1)
Basophils Relative: 1 %
Eosinophils Absolute: 0.1 10*3/uL (ref 0.0–0.5)
Eosinophils Relative: 1 %
HCT: 39.4 % (ref 36.0–46.0)
Hemoglobin: 12.6 g/dL (ref 12.0–15.0)
Immature Granulocytes: 0 %
Lymphocytes Relative: 37 %
Lymphs Abs: 1.8 10*3/uL (ref 0.7–4.0)
MCH: 26.4 pg (ref 26.0–34.0)
MCHC: 32 g/dL (ref 30.0–36.0)
MCV: 82.6 fL (ref 80.0–100.0)
Monocytes Absolute: 0.5 10*3/uL (ref 0.1–1.0)
Monocytes Relative: 9 %
Neutro Abs: 2.5 10*3/uL (ref 1.7–7.7)
Neutrophils Relative %: 52 %
Platelets: 188 10*3/uL (ref 150–400)
RBC: 4.77 MIL/uL (ref 3.87–5.11)
RDW: 15.6 % — ABNORMAL HIGH (ref 11.5–15.5)
WBC: 4.9 10*3/uL (ref 4.0–10.5)
nRBC: 0 % (ref 0.0–0.2)

## 2020-09-12 LAB — TYPE AND SCREEN
ABO/RH(D): A POS
Antibody Screen: NEGATIVE

## 2020-09-12 LAB — SURGICAL PCR SCREEN
MRSA, PCR: NEGATIVE
Staphylococcus aureus: NEGATIVE

## 2020-09-12 LAB — PROTIME-INR
INR: 1 (ref 0.8–1.2)
Prothrombin Time: 12.4 seconds (ref 11.4–15.2)

## 2020-09-12 LAB — APTT: aPTT: 31 seconds (ref 24–36)

## 2020-09-12 NOTE — Progress Notes (Signed)
COVID Vaccine Completed: YES Date COVID Vaccine completed: 03/07/20 COVID vaccine manufacturer:    Moderna     PCP -Dr. Nita Sells. LOV: 09/12/20  Cardiologist -   Chest x-ray -  EKG - 09/11/20 requested Stress Test -  ECHO -  Cardiac Cath -  Pacemaker/ICD device last checked:  Sleep Study -  CPAP -   Fasting Blood Sugar -  Checks Blood Sugar _____ times a day  Blood Thinner Instructions: Aspirin Instructions: Last Dose:  Anesthesia review: Hx: HTN,Rt. BBB(as per EKG done at MD. Office on 09/11/20). Pt is able to ambulated without getting SOB short distances,due to kneescand back pain.  Patient denies shortness of breath, fever, cough and chest pain at PAT appointment   Patient verbalized understanding of instructions that were given to them at the PAT appointment. Patient was also instructed that they will need to review over the PAT instructions again at home before surgery.

## 2020-09-17 NOTE — Care Plan (Signed)
Ortho Bundle Case Management Note  Patient Details  Name: [name] MRN: [id number] Date of Birth: [birth year]  Spoke with patient prior to surgery. She will discharge to home with family to assist. Rolling walker ordered for home use. HHPT referral to [facility] and OPPT set up with [facility]. Patient and MD in agreement with plan. Choice offered                     DME Arranged:  Walker rolling DME Agency:  [employer]  HH Arranged:  PT HH Agency:  [facility] (formerly [facility])  Additional Comments: Please contact me with any questions of if this plan should need to change.  Shauna Hugh,  RN,BSN,MHA,CCM  [facility] Orthopaedic Specialist  [phone] 09/17/2020, 1:14 PM

## 2020-09-20 ENCOUNTER — Other Ambulatory Visit (HOSPITAL_COMMUNITY)
Admission: RE | Admit: 2020-09-20 | Discharge: 2020-09-20 | Disposition: A | Discharge status: Home / Self Care | Payer: Medicare Other | Source: Ambulatory Visit | Attending: Orthopedic Surgery | Admitting: Orthopedic Surgery

## 2020-09-20 DIAGNOSIS — Z20822 Contact with and (suspected) exposure to covid-19: Secondary | ICD-10-CM | POA: Diagnosis not present

## 2020-09-20 DIAGNOSIS — Z01812 Encounter for preprocedural laboratory examination: Secondary | ICD-10-CM | POA: Diagnosis not present

## 2020-09-20 LAB — SARS CORONAVIRUS 2 (TAT 6-24 HRS): SARS Coronavirus 2: NEGATIVE

## 2020-09-21 DIAGNOSIS — S82142A Displaced bicondylar fracture of left tibia, initial encounter for closed fracture: Secondary | ICD-10-CM | POA: Diagnosis present

## 2020-09-21 DIAGNOSIS — M1712 Unilateral primary osteoarthritis, left knee: Secondary | ICD-10-CM | POA: Diagnosis present

## 2020-09-21 NOTE — H&P (Signed)
TOTAL KNEE ADMISSION H&P  Patient is being admitted for left total knee arthroplasty.  Subjective:  Chief Complaint:left knee pain.  HPI: [name], 74 y.o. female, has a history of pain and functional disability in the left knee due to arthritis and has failed non-surgical conservative treatments for greater than 12 weeks to includeNSAID's and/or analgesics, flexibility and strengthening excercises, use of assistive devices, weight reduction as appropriate and activity modification.  Onset of symptoms was gradual, starting 2 years ago with rapidlly worsening course since that time. The patient noted no past surgery on the left knee(s).  Patient currently rates pain in the left knee(s) at 10 out of 10 with activity. Patient has night pain, worsening of pain with activity and weight bearing, pain that interferes with activities of daily living, pain with passive range of motion and crepitus.  Patient has evidence of subchondral cysts, periarticular osteophytes and joint space narrowing by imaging studies. This patient has had subchondral cyst that has broken through the medial wall of the tibia . There is no active infection.  Patient Active Problem List   Diagnosis Date Noted  . Degenerative arthritis of left knee 09/21/2020  . Special screening for malignant neoplasms, colon    Past Medical History:  Diagnosis Date  . Anemia   . Anxiety   . Arthritis   . Hypertension   . Pre-diabetes     Past Surgical History:  Procedure Laterality Date  . ABDOMINAL HYSTERECTOMY    . BREAST CYST EXCISION Right    x2  . COLONOSCOPY    . COLONOSCOPY N/A 11/28/2015   Procedure: COLONOSCOPY;  Surgeon: Corbin Ade, MD;  Location: [facility];  Service: Endoscopy;  Laterality: N/A;  1:00 PM  . Right  breast lumpectomy  1975   benign    No current facility-administered medications for this encounter.   Current Outpatient Medications  Medication Sig Dispense Refill Last Dose  .  amLODipine (NORVASC) 10 MG tablet Take 10 mg by mouth daily.     Vedia Coffer Pepper-Turmeric (TURMERIC CURCUMIN) 04-999 MG CAPS Take 1 capsule by mouth daily.      . Calcium Carbonate-Vit D-Min (CALTRATE 600+D PLUS PO) Take 1 tablet by mouth daily.     . clorazepate (TRANXENE) 7.5 MG tablet Take 7.5 mg by mouth daily as needed for anxiety.      . diclofenac Sodium (VOLTAREN) 1 % GEL Apply 2 g topically 2 (two) times daily as needed (pain).     . ferrous sulfate 325 (65 FE) MG tablet Take 325 mg by mouth daily with breakfast.     . furosemide (LASIX) 20 MG tablet Take 20 mg by mouth daily as needed for fluid.      Marland Kitchen glucosamine-chondroitin 500-400 MG tablet Take 1 tablet by mouth daily.      . metoprolol succinate (TOPROL-XL) 50 MG 24 hr tablet Take 50 mg by mouth daily.     . Multiple Vitamins-Minerals (CENTRUM SILVER PO) Take 1 tablet by mouth daily.      . naproxen (NAPROSYN) 500 MG tablet Take 1 tablet (500 mg total) by mouth 2 (two) times daily with a meal. (Patient taking differently: Take 500 mg by mouth 2 (two) times daily as needed for moderate pain. ) 60 tablet 5   . traMADol (ULTRAM) 50 MG tablet Take 50 mg by mouth every 6 (six) hours as needed for moderate pain.      Marland Kitchen trolamine salicylate (ASPERCREME) 10 % cream Apply 1  application topically as needed for muscle pain.     . calcium carbonate (OSCAL) 1500 (600 CA) MG TABS tablet Take by mouth daily with breakfast. (Patient not taking: Reported on 09/05/2020)   Not Taking at Unknown time  . metoprolol (LOPRESSOR) 50 MG tablet Take 50 mg by mouth daily.  (Patient not taking: Reported on 09/05/2020)   Not Taking at Unknown time  . polyethylene glycol-electrolytes (TRILYTE) 420 G solution Take 4,000 mLs by mouth as directed. (Patient not taking: Reported on 05/25/2018) 4000 mL 0    No Known Allergies  Social History   Tobacco Use  . Smoking status: Former Smoker    Packs/day: 0.25    Years: 40.00    Pack years: 10.00    Types: Cigarettes     Quit date: 11/27/2009    Years since quitting: 10.8  . Smokeless tobacco: Never Used  Substance Use Topics  . Alcohol use: Yes    Comment: Socially    Family History  Problem Relation Age of Onset  . Breast cancer Neg Hx      Review of Systems  Constitutional: Positive for fatigue.  HENT: Negative.   Eyes: Negative.   Respiratory: Negative.   Cardiovascular:       Htn  Gastrointestinal: Negative.   Endocrine: Negative.   Genitourinary: Negative.   Musculoskeletal: Positive for arthralgias.  Skin: Negative.   Allergic/Immunologic: Negative.   Neurological: Negative.   Hematological: Negative.   Psychiatric/Behavioral: The patient is nervous/anxious.     Objective:  Physical Exam Constitutional:      Appearance: Normal appearance. She is normal weight.  HENT:     Head: Normocephalic and atraumatic.     Nose: Nose normal.  Eyes:     Pupils: Pupils are equal, round, and reactive to light.  Cardiovascular:     Pulses: Normal pulses.  Pulmonary:     Effort: Pulmonary effort is normal.  Musculoskeletal:        General: Tenderness and deformity present.     Cervical back: Normal range of motion and neck supple.     Comments: Tender along the medial joint line of left greater than right knee 5-10 varus deformities range of motion 5/100 on the left 5/110 on the right.  Toes are pink and well perfused neurovascular intact distally good quadriceps and hamstring power.    Skin:    General: Skin is warm and dry.  Neurological:     General: No focal deficit present.     Mental Status: She is alert and oriented to person, place, and time. Mental status is at baseline.  Psychiatric:        Mood and Affect: Mood normal.        Behavior: Behavior normal.        Thought Content: Thought content normal.        Judgment: Judgment normal.     Vital signs in last 24 hours:    Labs:   Estimated body mass index is 30.04 kg/m as calculated from the following:   Height as  of 09/12/20: 5\' 1"  (1.549 m).   Weight as of 09/12/20: 72.1 kg.   Imaging Review Plain radiographs demonstrate  end-stage arthritis of left greater than right knee.  On the left side the patient has a medial tibial plateau subchondral cyst that has broken through the medial wall of the tibia      Assessment/Plan:  End stage arthritis, left knee   The patient history, physical examination, clinical judgment  of the provider and imaging studies are consistent with end stage degenerative joint disease of the left knee(s) and total knee arthroplasty is deemed medically necessary. The treatment options including medical management, injection therapy arthroscopy and arthroplasty were discussed at length. The risks and benefits of total knee arthroplasty were presented and reviewed. The risks due to aseptic loosening, infection, stiffness, patella tracking problems, thromboembolic complications and other imponderables were discussed. The patient acknowledged the explanation, agreed to proceed with the plan and consent was signed. Patient is being admitted for inpatient treatment for surgery, pain control, PT, OT, prophylactic antibiotics, VTE prophylaxis, progressive ambulation and ADL's and discharge planning. The patient is planning to be discharged home with home health services     Patient's anticipated LOS is less than 2 midnights, meeting these requirements: - Younger than 65 - Lives within 1 hour of care - Has a competent adult at home to recover with post-op recover - NO history of  - Chronic pain requiring opiods  - Diabetes  - Coronary Artery Disease  - Heart failure  - Heart attack  - Stroke  - DVT/VTE  - Cardiac arrhythmia  - Respiratory Failure/COPD  - Renal failure  - Anemia  - Advanced Liver disease

## 2020-09-21 NOTE — Progress Notes (Signed)
Called patient with time change for surgery on 09/24/20. Patient to arrive at [facility] for 0845 surgery. Complete ERAS drink by 0545. She verbalizes understanding.

## 2020-09-22 MED ORDER — BUPIVACAINE LIPOSOME 1.3 % IJ SUSP
20.0000 mL | INTRAMUSCULAR | Status: DC
  Filled 2020-09-22: qty 20

## 2020-09-22 MED ORDER — TRANEXAMIC ACID 1000 MG/10ML IV SOLN
2000.0000 mg | INTRAVENOUS | Status: DC
  Filled 2020-09-22: qty 20

## 2020-09-23 NOTE — Anesthesia Preprocedure Evaluation (Addendum)
Anesthesia Evaluation  Patient identified by MRN, date of birth, ID band Patient awake    Reviewed: Allergy & Precautions, NPO status , Patient's Chart, lab work & pertinent test results, reviewed documented beta blocker date and time   Airway Mallampati: II  TM Distance: >3 FB Neck ROM: Full    Dental  (+) Partial Upper   Pulmonary Patient abstained from smoking., former smoker,    Pulmonary exam normal breath sounds clear to auscultation       Cardiovascular hypertension, Pt. on medications and Pt. on home beta blockers Normal cardiovascular exam Rhythm:Regular Rate:Normal     Neuro/Psych PSYCHIATRIC DISORDERS Anxiety negative neurological ROS     GI/Hepatic negative GI ROS, Neg liver ROS,   Endo/Other  Obesity   Renal/GU negative Renal ROS     Musculoskeletal  (+) Arthritis , Osteoarthritis,  LEFT KNEE OSTEOARTHRITIS   Abdominal   Peds  Hematology negative hematology ROS (+) Plt 188k   Anesthesia Other Findings   Reproductive/Obstetrics                           Anesthesia Physical Anesthesia Plan  ASA: II  Anesthesia Plan: Spinal   Post-op Pain Management:  Regional for Post-op pain   Induction: Intravenous  PONV Risk Score and Plan: 2 and Propofol infusion and Treatment may vary due to age or medical condition  Airway Management Planned: Natural Airway and Nasal Cannula  Additional Equipment:   Intra-op Plan:   Post-operative Plan:   Informed Consent: I have reviewed the patients History and Physical, chart, labs and discussed the procedure including the risks, benefits and alternatives for the proposed anesthesia with the patient or authorized representative who has indicated his/her understanding and acceptance.       Plan Discussed with: CRNA  Anesthesia Plan Comments:        Anesthesia Quick Evaluation

## 2020-09-24 ENCOUNTER — Ambulatory Visit: Admit: 2020-09-24 | Payer: Medicare Other | Admitting: Orthopedic Surgery

## 2020-09-24 ENCOUNTER — Ambulatory Visit (HOSPITAL_COMMUNITY): Payer: Medicare Other | Admitting: Anesthesiology

## 2020-09-24 ENCOUNTER — Encounter (HOSPITAL_COMMUNITY): Payer: Self-pay | Admitting: Orthopedic Surgery

## 2020-09-24 ENCOUNTER — Ambulatory Visit (HOSPITAL_COMMUNITY)
Admission: RE | Admit: 2020-09-24 | Discharge: 2020-09-24 | Disposition: A | Discharge status: Home / Self Care | Payer: Medicare Other | Attending: Orthopedic Surgery | Admitting: Orthopedic Surgery

## 2020-09-24 ENCOUNTER — Encounter (HOSPITAL_COMMUNITY)
Admission: RE | Disposition: A | Discharge status: Home / Self Care | Payer: Self-pay | Source: Home / Self Care | Attending: Orthopedic Surgery

## 2020-09-24 ENCOUNTER — Ambulatory Visit (HOSPITAL_COMMUNITY): Payer: Medicare Other | Admitting: Physician Assistant

## 2020-09-24 DIAGNOSIS — S82142A Displaced bicondylar fracture of left tibia, initial encounter for closed fracture: Secondary | ICD-10-CM

## 2020-09-24 DIAGNOSIS — I10 Essential (primary) hypertension: Secondary | ICD-10-CM | POA: Diagnosis not present

## 2020-09-24 DIAGNOSIS — E669 Obesity, unspecified: Secondary | ICD-10-CM | POA: Diagnosis not present

## 2020-09-24 DIAGNOSIS — M1712 Unilateral primary osteoarthritis, left knee: Secondary | ICD-10-CM | POA: Diagnosis present

## 2020-09-24 DIAGNOSIS — Z96652 Presence of left artificial knee joint: Secondary | ICD-10-CM | POA: Diagnosis not present

## 2020-09-24 DIAGNOSIS — M84662A Pathological fracture in other disease, left tibia, initial encounter for fracture: Secondary | ICD-10-CM | POA: Insufficient documentation

## 2020-09-24 DIAGNOSIS — Z79899 Other long term (current) drug therapy: Secondary | ICD-10-CM | POA: Insufficient documentation

## 2020-09-24 DIAGNOSIS — R7303 Prediabetes: Secondary | ICD-10-CM | POA: Diagnosis not present

## 2020-09-24 DIAGNOSIS — Z87891 Personal history of nicotine dependence: Secondary | ICD-10-CM | POA: Diagnosis not present

## 2020-09-24 DIAGNOSIS — G8918 Other acute postprocedural pain: Secondary | ICD-10-CM | POA: Diagnosis not present

## 2020-09-24 HISTORY — PX: TOTAL KNEE ARTHROPLASTY: SHX125

## 2020-09-24 LAB — ABO/RH: ABO/RH(D): A POS

## 2020-09-24 SURGERY — ARTHROPLASTY, KNEE, TOTAL
Anesthesia: Spinal | Site: Knee | Laterality: Left

## 2020-09-24 MED ORDER — PROPOFOL 500 MG/50ML IV EMUL
INTRAVENOUS | Status: DC | PRN
  Administered 2020-09-24: 100 ug/kg/min via INTRAVENOUS

## 2020-09-24 MED ORDER — DEXAMETHASONE SODIUM PHOSPHATE 10 MG/ML IJ SOLN
INTRAMUSCULAR | Status: AC
  Filled 2020-09-24: qty 1

## 2020-09-24 MED ORDER — LACTATED RINGERS IV SOLN: INTRAVENOUS | Status: DC | PRN

## 2020-09-24 MED ORDER — ASPIRIN EC 81 MG PO TBEC: 81.0000 mg | DELAYED_RELEASE_TABLET | Freq: Every day | ORAL | 2 refills | Status: AC

## 2020-09-24 MED ORDER — PHENYLEPHRINE HCL-NACL 10-0.9 MG/250ML-% IV SOLN
INTRAVENOUS | Status: DC | PRN
  Administered 2020-09-24: 50 ug/min via INTRAVENOUS

## 2020-09-24 MED ORDER — POVIDONE-IODINE 10 % EX SWAB
2.0000 "application " | Freq: Once | CUTANEOUS | Status: AC
  Administered 2020-09-24: 2 via TOPICAL

## 2020-09-24 MED ORDER — CEFAZOLIN SODIUM-DEXTROSE 2-4 GM/100ML-% IV SOLN
2.0000 g | INTRAVENOUS | Status: AC
  Administered 2020-09-24: 2 g via INTRAVENOUS
  Filled 2020-09-24: qty 100

## 2020-09-24 MED ORDER — FENTANYL CITRATE (PF) 100 MCG/2ML IJ SOLN
25.0000 ug | INTRAMUSCULAR | Status: DC | PRN
  Administered 2020-09-24 (×2): 50 ug via INTRAVENOUS

## 2020-09-24 MED ORDER — TRAMADOL HCL 50 MG PO TABS
ORAL_TABLET | ORAL | Status: AC
  Administered 2020-09-24: 50 mg via ORAL
  Filled 2020-09-24: qty 1

## 2020-09-24 MED ORDER — FENTANYL CITRATE (PF) 100 MCG/2ML IJ SOLN
INTRAMUSCULAR | Status: AC
  Administered 2020-09-24: 50 ug via INTRAVENOUS
  Filled 2020-09-24: qty 2

## 2020-09-24 MED ORDER — WATER FOR IRRIGATION, STERILE IR SOLN
Status: DC | PRN
  Administered 2020-09-24: 1000 mL

## 2020-09-24 MED ORDER — CLONIDINE HCL (ANALGESIA) 100 MCG/ML EP SOLN
EPIDURAL | Status: DC | PRN
  Administered 2020-09-24: 50 ug

## 2020-09-24 MED ORDER — BUPIVACAINE LIPOSOME 1.3 % IJ SUSP
INTRAMUSCULAR | Status: DC | PRN
  Administered 2020-09-24: 20 mL

## 2020-09-24 MED ORDER — ACETAMINOPHEN 500 MG PO TABS
1000.0000 mg | ORAL_TABLET | Freq: Once | ORAL | Status: AC
  Administered 2020-09-24: 1000 mg via ORAL
  Filled 2020-09-24: qty 2

## 2020-09-24 MED ORDER — PROPOFOL 1000 MG/100ML IV EMUL
INTRAVENOUS | Status: AC
  Filled 2020-09-24: qty 100

## 2020-09-24 MED ORDER — DEXAMETHASONE SODIUM PHOSPHATE 10 MG/ML IJ SOLN
INTRAMUSCULAR | Status: DC | PRN
  Administered 2020-09-24: 10 mg via INTRAVENOUS

## 2020-09-24 MED ORDER — SODIUM CHLORIDE 0.9 % IR SOLN
Status: DC | PRN
  Administered 2020-09-24: 1000 mL

## 2020-09-24 MED ORDER — MEPIVACAINE HCL (PF) 2 % IJ SOLN
INTRAMUSCULAR | Status: DC | PRN
  Administered 2020-09-24: 3 mL via INTRATHECAL

## 2020-09-24 MED ORDER — OXYCODONE HCL 5 MG PO TABS: 5.0000 mg | ORAL_TABLET | ORAL | 0 refills | Status: AC | PRN

## 2020-09-24 MED ORDER — PHENYLEPHRINE HCL (PRESSORS) 10 MG/ML IV SOLN
INTRAVENOUS | Status: AC
  Filled 2020-09-24: qty 2

## 2020-09-24 MED ORDER — TRANEXAMIC ACID-NACL 1000-0.7 MG/100ML-% IV SOLN
1000.0000 mg | INTRAVENOUS | Status: AC
  Administered 2020-09-24: 1000 mg via INTRAVENOUS
  Filled 2020-09-24: qty 100

## 2020-09-24 MED ORDER — HYDROMORPHONE HCL 2 MG PO TABS: 2.0000 mg | ORAL_TABLET | ORAL | Status: DC | PRN

## 2020-09-24 MED ORDER — EPHEDRINE 5 MG/ML INJ
INTRAVENOUS | Status: AC
  Filled 2020-09-24: qty 10

## 2020-09-24 MED ORDER — TRANEXAMIC ACID 1000 MG/10ML IV SOLN
INTRAVENOUS | Status: DC | PRN
  Administered 2020-09-24: 2000 mg via TOPICAL

## 2020-09-24 MED ORDER — 0.9 % SODIUM CHLORIDE (POUR BTL) OPTIME
TOPICAL | Status: DC | PRN
  Administered 2020-09-24: 1000 mL

## 2020-09-24 MED ORDER — SODIUM CHLORIDE (PF) 0.9 % IJ SOLN
INTRAMUSCULAR | Status: AC
  Filled 2020-09-24: qty 20

## 2020-09-24 MED ORDER — LACTATED RINGERS IV SOLN: INTRAVENOUS | Status: DC

## 2020-09-24 MED ORDER — ORAL CARE MOUTH RINSE: 15.0000 mL | Freq: Once | OROMUCOSAL | Status: AC

## 2020-09-24 MED ORDER — TRAMADOL HCL 50 MG PO TABS: 50.0000 mg | ORAL_TABLET | Freq: Four times a day (QID) | ORAL | Status: DC | PRN

## 2020-09-24 MED ORDER — CHLORHEXIDINE GLUCONATE 0.12 % MT SOLN
15.0000 mL | Freq: Once | OROMUCOSAL | Status: AC
  Administered 2020-09-24: 15 mL via OROMUCOSAL

## 2020-09-24 MED ORDER — LIDOCAINE HCL (CARDIAC) PF 100 MG/5ML IV SOSY
PREFILLED_SYRINGE | INTRAVENOUS | Status: DC | PRN
  Administered 2020-09-24: 60 mg via INTRAVENOUS

## 2020-09-24 MED ORDER — TIZANIDINE HCL 2 MG PO CAPS: 4.0000 mg | ORAL_CAPSULE | Freq: Three times a day (TID) | ORAL | 0 refills | Status: DC | PRN

## 2020-09-24 MED ORDER — ROPIVACAINE HCL 7.5 MG/ML IJ SOLN
INTRAMUSCULAR | Status: DC | PRN
  Administered 2020-09-24: 20 mL via PERINEURAL

## 2020-09-24 MED ORDER — PROPOFOL 10 MG/ML IV BOLUS
INTRAVENOUS | Status: DC | PRN
  Administered 2020-09-24: 30 mg via INTRAVENOUS

## 2020-09-24 MED ORDER — FENTANYL CITRATE (PF) 100 MCG/2ML IJ SOLN
50.0000 ug | INTRAMUSCULAR | Status: DC
  Administered 2020-09-24: 50 ug via INTRAVENOUS
  Filled 2020-09-24: qty 2

## 2020-09-24 MED ORDER — EPHEDRINE SULFATE-NACL 50-0.9 MG/10ML-% IV SOSY
PREFILLED_SYRINGE | INTRAVENOUS | Status: DC | PRN
  Administered 2020-09-24: 10 mg via INTRAVENOUS

## 2020-09-24 MED ORDER — FENTANYL CITRATE (PF) 100 MCG/2ML IJ SOLN
INTRAMUSCULAR | Status: AC
  Filled 2020-09-24: qty 2

## 2020-09-24 MED ORDER — ONDANSETRON HCL 4 MG/2ML IJ SOLN
INTRAMUSCULAR | Status: AC
  Filled 2020-09-24: qty 2

## 2020-09-24 MED ORDER — LACTATED RINGERS IV BOLUS
250.0000 mL | Freq: Once | INTRAVENOUS | Status: AC
  Administered 2020-09-24: 250 mL via INTRAVENOUS

## 2020-09-24 MED ORDER — MIDAZOLAM HCL 2 MG/2ML IJ SOLN
1.0000 mg | INTRAMUSCULAR | Status: DC
  Administered 2020-09-24: 1 mg via INTRAVENOUS
  Filled 2020-09-24: qty 2

## 2020-09-24 MED ORDER — SODIUM CHLORIDE (PF) 0.9 % IJ SOLN
INTRAMUSCULAR | Status: DC | PRN
  Administered 2020-09-24: 70 mL

## 2020-09-24 MED ORDER — LACTATED RINGERS IV BOLUS
500.0000 mL | Freq: Once | INTRAVENOUS | Status: AC
  Administered 2020-09-24: 500 mL via INTRAVENOUS

## 2020-09-24 MED ORDER — BUPIVACAINE-EPINEPHRINE (PF) 0.25% -1:200000 IJ SOLN
INTRAMUSCULAR | Status: DC | PRN
  Administered 2020-09-24: 30 mL via PERINEURAL

## 2020-09-24 MED ORDER — ONDANSETRON HCL 4 MG/2ML IJ SOLN: 4.0000 mg | Freq: Once | INTRAMUSCULAR | Status: DC | PRN

## 2020-09-24 MED ORDER — BUPIVACAINE-EPINEPHRINE (PF) 0.25% -1:200000 IJ SOLN
INTRAMUSCULAR | Status: AC
  Filled 2020-09-24: qty 30

## 2020-09-24 MED ORDER — LIDOCAINE 2% (20 MG/ML) 5 ML SYRINGE
INTRAMUSCULAR | Status: AC
  Filled 2020-09-24: qty 5

## 2020-09-24 MED ORDER — ONDANSETRON HCL 4 MG/2ML IJ SOLN
INTRAMUSCULAR | Status: DC | PRN
  Administered 2020-09-24: 4 mg via INTRAVENOUS

## 2020-09-24 MED ORDER — SODIUM CHLORIDE (PF) 0.9 % IJ SOLN
INTRAMUSCULAR | Status: AC
  Filled 2020-09-24: qty 50

## 2020-09-24 SURGICAL SUPPLY — 55 items
ATTUNE PS FEM LT SZ 4 CEM KNEE (Femur) ×2 IMPLANT
ATTUNE PSRP INSR SZ4 5 KNEE (Insert) ×1 IMPLANT
ATTUNE PSRP INSR SZ4 5MM KNEE (Insert) ×1 IMPLANT
BAG DECANTER FOR FLEXI CONT (MISCELLANEOUS) ×3 IMPLANT
BAG SPEC THK2 15X12 ZIP CLS (MISCELLANEOUS) ×1
BAG ZIPLOCK 12X15 (MISCELLANEOUS) ×3 IMPLANT
BASEPLATE TIBIAL ROTATING SZ 4 (Knees) ×2 IMPLANT
BLADE SAG 18X100X1.27 (BLADE) ×3 IMPLANT
BLADE SAW SGTL 11.0X1.19X90.0M (BLADE) ×3 IMPLANT
BLADE SURG SZ10 CARB STEEL (BLADE) ×6 IMPLANT
BNDG CMPR MED 10X6 ELC LF (GAUZE/BANDAGES/DRESSINGS) ×1
BNDG ELASTIC 6X10 VLCR STRL LF (GAUZE/BANDAGES/DRESSINGS) ×3 IMPLANT
BOWL SMART MIX CTS (DISPOSABLE) ×3 IMPLANT
BSPLAT TIB 4 CMNT ROT PLAT STR (Knees) ×1 IMPLANT
CEMENT HV SMART SET (Cement) ×4 IMPLANT
COVER SURGICAL LIGHT HANDLE (MISCELLANEOUS) ×3 IMPLANT
COVER WAND RF STERILE (DRAPES) IMPLANT
CUFF TOURN SGL QUICK 34 (TOURNIQUET CUFF) ×3
CUFF TRNQT CYL 34X4.125X (TOURNIQUET CUFF) ×1 IMPLANT
DECANTER SPIKE VIAL GLASS SM (MISCELLANEOUS) ×9 IMPLANT
DRAPE ORTHO SPLIT 77X108 STRL (DRAPES)
DRAPE SURG ORHT 6 SPLT 77X108 (DRAPES) IMPLANT
DRAPE U-SHAPE 47X51 STRL (DRAPES) ×3 IMPLANT
DRSG AQUACEL AG ADV 3.5X10 (GAUZE/BANDAGES/DRESSINGS) ×3 IMPLANT
DURAPREP 26ML APPLICATOR (WOUND CARE) ×3 IMPLANT
ELECT REM PT RETURN 15FT ADLT (MISCELLANEOUS) ×3 IMPLANT
GLOVE BIO SURGEON STRL SZ7.5 (GLOVE) ×3 IMPLANT
GLOVE BIO SURGEON STRL SZ8.5 (GLOVE) ×3 IMPLANT
GLOVE BIOGEL PI IND STRL 8 (GLOVE) ×1 IMPLANT
GLOVE BIOGEL PI IND STRL 9 (GLOVE) ×1 IMPLANT
GLOVE BIOGEL PI INDICATOR 8 (GLOVE) ×2
GLOVE BIOGEL PI INDICATOR 9 (GLOVE) ×2
GOWN STRL REUS W/TWL XL LVL3 (GOWN DISPOSABLE) ×6 IMPLANT
HANDPIECE INTERPULSE COAX TIP (DISPOSABLE) ×3
HOOD PEEL AWAY FLYTE STAYCOOL (MISCELLANEOUS) ×9 IMPLANT
KIT TURNOVER KIT A (KITS) IMPLANT
NDL HYPO 21X1.5 SAFETY (NEEDLE) ×2 IMPLANT
NEEDLE HYPO 21X1.5 SAFETY (NEEDLE) ×6 IMPLANT
NS IRRIG 1000ML POUR BTL (IV SOLUTION) ×3 IMPLANT
PACK ICE MAXI GEL EZY WRAP (MISCELLANEOUS) ×3 IMPLANT
PACK TOTAL KNEE CUSTOM (KITS) ×3 IMPLANT
PATELLA MEDIAL ATTUN 35MM KNEE (Knees) ×2 IMPLANT
PENCIL SMOKE EVACUATOR (MISCELLANEOUS) IMPLANT
PIN DRILL FIX HALF THREAD (BIT) ×2 IMPLANT
PIN STEINMAN FIXATION KNEE (PIN) ×2 IMPLANT
PROTECTOR NERVE ULNAR (MISCELLANEOUS) ×3 IMPLANT
SET HNDPC FAN SPRY TIP SCT (DISPOSABLE) ×1 IMPLANT
SUT VIC AB 1 CTX 36 (SUTURE) ×3
SUT VIC AB 1 CTX36XBRD ANBCTR (SUTURE) ×1 IMPLANT
SUT VIC AB 3-0 CT1 27 (SUTURE) ×9
SUT VIC AB 3-0 CT1 TAPERPNT 27 (SUTURE) ×3 IMPLANT
SYR CONTROL 10ML LL (SYRINGE) ×6 IMPLANT
TRAY FOLEY MTR SLVR 16FR STAT (SET/KITS/TRAYS/PACK) ×3 IMPLANT
WATER STERILE IRR 1000ML POUR (IV SOLUTION) ×6 IMPLANT
WRAP KNEE MAXI GEL POST OP (GAUZE/BANDAGES/DRESSINGS) ×2 IMPLANT

## 2020-09-24 NOTE — Interval H&P Note (Signed)
History and Physical Interval Note:  09/24/2020 6:51 AM  [name]  has presented today for surgery, with the diagnosis of LEFT KNEE OSTEOARTHRITIS.  The various methods of treatment have been discussed with the patient and family. After consideration of risks, benefits and other options for treatment, the patient has consented to  Procedure(s): LEFT TOTAL KNEE ARTHROPLASTY (Left) as a surgical intervention.  The patient's history has been reviewed, patient examined, no change in status, stable for surgery.  I have reviewed the patient's chart and labs.  Questions were answered to the patient's satisfaction.     Nestor Lewandowsky

## 2020-09-24 NOTE — Anesthesia Procedure Notes (Signed)
Anesthesia Regional Block: Adductor canal block   Pre-Anesthetic Checklist: ,, timeout performed, Correct Patient, Correct Site, Correct Laterality, Correct Procedure, Correct Position, site marked, Risks and benefits discussed,  Surgical consent,  Pre-op evaluation,  At surgeon's request and post-op pain management  Laterality: Left  Prep: chloraprep       Needles:  Injection technique: Single-shot  Needle Type: Echogenic Needle     Needle Length: 9cm  Needle Gauge: 21     Additional Needles:   Procedures:,,,, ultrasound used (permanent image in chart),,,,  Narrative:  Start time: 09/24/2020 8:06 AM End time: 09/24/2020 8:11 AM Injection made incrementally with aspirations every 5 mL.  Performed by: Personally  Anesthesiologist: Cecile Hearing, MD  Additional Notes: No pain on injection. No increased resistance to injection. Injection made in 5cc increments.  Good needle visualization.  Patient tolerated procedure well.

## 2020-09-24 NOTE — Transfer of Care (Signed)
Immediate Anesthesia Transfer of Care Note  Patient: Axel Filler  Procedure(s) Performed: LEFT TOTAL KNEE ARTHROPLASTY (Left Knee)  Patient Location: PACU  Anesthesia Type:Spinal  Level of Consciousness: awake, alert  and oriented  Airway & Oxygen Therapy: Patient Spontanous Breathing and Patient connected to face mask oxygen  Post-op Assessment: Report given to RN and Post -op Vital signs reviewed and stable  Post vital signs: Reviewed and stable  Last Vitals:  Vitals Value Taken Time  BP    Temp    Pulse 66 09/24/20 1109  Resp    SpO2 95 % 09/24/20 1109  Vitals shown include unvalidated device data.  Last Pain:  Vitals:   09/24/20 0707  TempSrc:   PainSc: 0-No pain         Complications: No complications documented.

## 2020-09-24 NOTE — Op Note (Signed)
PATIENT ID:      [name]  MRN:     [id number] DOB/AGE:    1946/10/07 / 74 y.o.       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   Weight as of this encounter: 72.1 kg.                                                       POSTOPERATIVE DIAGNOSIS:   Same                                                                  PROCEDURE:  Procedure(s): LEFT TOTAL KNEE ARTHROPLASTY Using DepuyAttune RP implants #4L Femur, #4Tibia, 5 mm Attune RP bearing, 35 Patella    SURGEON:  ASSISTANT:   Nestor Lewandowsky. Tomi Likens   (Present and scrubbed throughout the case, critical for assistance with exposure, retraction, instrumentation, and closure.)        ANESTHESIA: Spinal, 20cc Exparel, 50cc 0.25% Marcaine EBL: 300 cc FLUID REPLACEMENT: 1500 cc crystaloid TOURNIQUET: DRAINS: None TRANEXAMIC ACID: 1gm IV, 2gm topical COMPLICATIONS:  None         INDICATIONS FOR PROCEDURE: The patient has  LEFT KNEE medial tibial plateau fracture with medial compartment osteoarthritis and varus deformity.  XR shows medial tibial plateau fracture confirmed by CT scan, bone on bone arthritis, lateral subluxation of tibia. Patient has failed all conservative measures including anti-inflammatory medicines, narcotics, attempts at exercise and weight loss, cortisone injections and viscosupplementation.  Risks and benefits of surgery have been discussed, questions answered.   DESCRIPTION OF PROCEDURE: The patient identified by armband, received  IV antibiotics, in the holding area at [facility]. Patient taken to the operating room, appropriate anesthetic monitors were attached, and spinal anesthesia was  induced. IV Tranexamic acid was given.Tourniquet applied high to the  operative thigh. Lateral post and foot positioner applied to the table, the lower extremity was then prepped and draped in usual sterile fashion from the toes to the tourniquet. Time-out procedure was performed. [facility]. [facility] PAC, was present and scrubbed throughout the case, critical for assistance with, positioning, exposure, retraction, instrumentation, and closure.The skin and subcutaneous tissue along the incision was injected with 20 cc of a mixture of Exparel and Marcaine solution, using a 20-gauge by 1-1/2 inch needle. We began the operation, with the knee flexed 130 degrees, by making the anterior midline incision starting at handbreadth above the patella going over the patella 1 cm medial to and 4 cm distal to the tibial tubercle. Small bleeders in the skin and the subcutaneous tissue identified and cauterized. Transverse retinaculum was incised and reflected medially and a medial parapatellar arthrotomy was accomplished. the patella was everted and theprepatellar fat pad resected. The superficial medial collateral ligament was then elevated from anterior to posterior along the proximal  flare of the tibia and anterior half of the menisci resected. The knee was hyperflexed exposing bone on bone arthritis. Peripheral and notch osteophytes as well as the cruciate ligaments were then resected. We continued to work our way around posteriorly along the proximal tibia, and externally rotated the tibia subluxing it out from underneath the femur. A McHale PCL retractor was placed through the notch and a lateral Hohmann retractor placed, and we then entered the proximal tibia in line with the Depuy starter drill in line with the axis of the tibia followed by an intramedullary guide rod and 0-degree posterior slope cutting guide. The tibial cutting guide, 4 degree posterior sloped, was pinned into place allowing resection of 5 mm of bone medially and 12 mm of bone laterally. Satisfied with the tibial resection, we  then entered the distal femur 2 mm anterior to the PCL origin with the intramedullary guide rod and applied the distal femoral cutting guide set at 9 mm, with 5 degrees of valgus. This was pinned along the epicondylar axis. At this point, the distal femoral cut was accomplished without difficulty. We then sized for a #4 left femoral component and pinned the guide in 3 degrees of external rotation. The chamfer cutting guide was pinned into place. The anterior, posterior, and chamfer cuts were accomplished without difficulty followed by the Attune RP box cutting guide and the box cut. We also removed posterior osteophytes from the posterior femoral condyles. The posterior capsule was injected with Exparel solution. The knee was brought into full extension. We checked our extension gap and fit a 5 mm bearing. Distracting in extension with a lamina spreader,  bleeders in the posterior capsule, Posterior medial and posterior lateral gutter were cauterized.  The transexamic acid-soaked sponge was then placed in the gap of the knee in extension. The knee was flexed 30. The posterior patella cut was accomplished with the 9.5 mm Attune cutting guide, sized for a 35 mm dome, and the fixation pegs drilled.The knee was then once again hyperflexed exposing the proximal tibia. We sized for a #4 tibial base plate, applied the smokestack and the conical reamer followed by the the Delta fin keel punch. We then hammered into place the Attune RP trial femoral component, drilled the lugs, inserted a 5 mm trial bearing, trial patellar button, and took the knee through range of motion from 0-130 degrees. Medial and lateral ligamentous stability was checked. No thumb pressure was required for patellar Tracking. The tourniquet was not used. All trial components were removed, mating surfaces irrigated with pulse lavage, and dried with suction and sponges. 10 cc of the Exparel solution was applied to the cancellus bone of the patella distal  femur and proximal tibia.  After waiting 30 seconds, the bony surfaces were again, dried with sponges. A double batch of DePuy HV cement was mixed and applied to all bony metallic mating surfaces except for the posterior condyles of the femur itself. In order, we hammered into place the tibial tray and removed excess cement, the femoral component and removed excess cement. The final Attune RP bearing was inserted, and the knee brought to full extension with compression. The patellar button was clamped into place, and excess cement removed. The knee was held at 30 flexion with compression, while the cement cured. The wound was irrigated out with normal saline solution pulse lavage. The rest of the Exparel was injected into the parapatellar arthrotomy, subcutaneous tissues, and periosteal tissues. The parapatellar arthrotomy was closed with running #1  Vicryl suture. The subcutaneous tissue with 3-0 undyed Vicryl suture, and the skin with running 3-0 SQ vicryl. An Aquacil and Ace wrap were applied. The patient was taken to recovery room without difficulty.   Nestor Lewandowsky 09/24/2020, 10:27 AM

## 2020-09-24 NOTE — Progress Notes (Signed)
Orthopedic Tech Progress Note Patient Details:  [name] 05-May-1946 [id number]  Ortho Devices Type of Ortho Device: Knee Immobilizer Ortho Device/Splint Location: bedside Ortho Device/Splint Interventions: Application   Post Interventions Patient Tolerated: Well Instructions Provided: Care of device   Saul Fordyce 09/24/2020, 3:43 PM

## 2020-09-24 NOTE — Progress Notes (Signed)
Orthopedic Tech Progress Note Patient Details:  [name] 1946-11-02 [id number]  Ortho Devices Type of Ortho Device: Bone foam zero knee Ortho Device/Splint Location: bedside Ortho Device/Splint Interventions: Ordered   Post Interventions Patient Tolerated: Well Instructions Provided: Care of device   Jennye Moccasin 09/24/2020, 11:00 AM

## 2020-09-24 NOTE — Anesthesia Postprocedure Evaluation (Signed)
Anesthesia Post Note  Patient: [name]  Procedure(s) Performed: LEFT TOTAL KNEE ARTHROPLASTY (Left Knee)     Patient location during evaluation: PACU Anesthesia Type: Spinal Level of consciousness: oriented and awake and alert Pain management: pain level controlled Vital Signs Assessment: post-procedure vital signs reviewed and stable Respiratory status: spontaneous breathing, respiratory function stable and patient connected to nasal cannula oxygen Cardiovascular status: blood pressure returned to baseline and stable Postop Assessment: no headache, no backache, no apparent nausea or vomiting and spinal receding Anesthetic complications: no   No complications documented.  Last Vitals:  Vitals:   09/24/20 1145 09/24/20 1200  BP: 119/66 (!) 115/58  Pulse: 61 61  Resp: 14 14  Temp:    SpO2: 91% 95%    Last Pain:  Vitals:   09/24/20 1200  TempSrc:   PainSc: 6                  Cecile Hearing

## 2020-09-24 NOTE — Discharge Instructions (Signed)

## 2020-09-24 NOTE — Progress Notes (Signed)
AssistedDr. Turk with left, ultrasound guided, adductor canal block. Side rails up, monitors on throughout procedure. See vital signs in flow sheet. Tolerated Procedure well.  

## 2020-09-24 NOTE — Evaluation (Signed)
Physical Therapy Evaluation Patient Details Name: [name] MRN: [id number] DOB: 1946-08-07 Today's Date: 09/24/2020   History of Present Illness  s/p L TKA. PMH: HTN  Clinical Impression  Pt is s/p TKA resulting in the deficits listed below (see PT Problem List). Reviewed TKA HEP, knee precautions and activity progression OOB deferred at this time d/t pt with significant quad lag that will likely result in knee buckling if we attempt standing at this time   Pt will benefit from skilled PT to increase their independence and safety with mobility to allow discharge to the venue listed below.      Follow Up Recommendations Follow surgeon's recommendation for DC plan and follow-up therapies    Equipment Recommendations  Rolling walker with 5" wheels    Recommendations for Other Services       Precautions / Restrictions Precautions Precautions: Fall;Knee      Mobility  Bed Mobility               General bed mobility comments: deferred at this time d/ significant quad lag  Transfers                    Ambulation/Gait                Stairs            Wheelchair Mobility    Modified Rankin (Stroke Patients Only)       Balance                                             Pertinent Vitals/Pain Pain Assessment: 0-10 Pain Score: 3  Pain Location: L knee Pain Descriptors / Indicators: Aching;Grimacing;Discomfort Pain Intervention(s): Limited activity within patient's tolerance;Monitored during session    Home Living Family/patient expects to be discharged to:: Private residence Living Arrangements: Other relatives (brother for 2 wks) Available Help at Discharge: Family;Available 24 hours/day Type of Home: House Home Access: Stairs to enter Entrance Stairs-Rails: Right;Left;Can reach both Entrance Stairs-Number of Steps: 3 Home Layout: One level Home Equipment: Cane - single point;Bedside commode       Prior Function Level of Independence: Independent               Hand Dominance        Extremity/Trunk Assessment   Upper Extremity Assessment Upper Extremity Assessment: Overall WFL for tasks assessed    Lower Extremity Assessment Lower Extremity Assessment: LLE deficits/detail LLE Deficits / Details: ankle WFL, knee with ~25 degree quad lag. hip flexion 2+/5       Communication   Communication: No difficulties  Cognition Arousal/Alertness: Awake/alert Behavior During Therapy: WFL for tasks assessed/performed Overall Cognitive Status: Within Functional Limits for tasks assessed                                        General Comments      Exercises Total Joint Exercises Ankle Circles/Pumps: AROM;10 reps Quad Sets: AROM;10 reps Heel Slides: 10 reps;AAROM;Left Hip ABduction/ADduction: AAROM;5 reps Straight Leg Raises: AAROM;5 reps;Left   Assessment/Plan    PT Assessment Patient needs continued PT services  PT Problem List Decreased strength;Decreased range of motion;Decreased balance;Decreased knowledge of use of DME;Decreased mobility       PT Treatment Interventions DME instruction;Therapeutic activities;Gait  training;Functional mobility training;Therapeutic exercise;Stair training    PT Goals (Current goals can be found in the Care Plan section)  Acute Rehab PT Goals Patient Stated Goal: home today PT Goal Formulation: With patient Time For Goal Achievement: 10/01/20 Potential to Achieve Goals: Good    Frequency 7X/week   Barriers to discharge        Co-evaluation               AM-PAC PT "6 Clicks" Mobility  Outcome Measure Help needed turning from your back to your side while in a flat bed without using bedrails?: A Lot Help needed moving from lying on your back to sitting on the side of a flat bed without using bedrails?: A Lot Help needed moving to and from a bed to a chair (including a wheelchair)?: Total Help needed  standing up from a chair using your arms (e.g., wheelchair or bedside chair)?: Total Help needed to walk in hospital room?: Total Help needed climbing 3-5 steps with a railing? : Total 6 Click Score: 8    End of Session   Activity Tolerance: Patient tolerated treatment well Patient left: in bed;with call bell/phone within reach   PT Visit Diagnosis: Difficulty in walking, not elsewhere classified (R26.2)    Time: 0623-7628 PT Time Calculation (min) (ACUTE ONLY): 18 min   Charges:   PT Evaluation $PT Eval Low Complexity: 1 Low          Lovell Nuttall, PT  Acute Rehab Dept ([facility]) [phone] Pager [phone]  09/24/2020   [facility] 09/24/2020, 2:04 PM

## 2020-09-24 NOTE — Anesthesia Procedure Notes (Signed)
Spinal  Patient location during procedure: OR Start time: 09/24/2020 8:48 AM End time: 09/24/2020 8:52 AM Staffing Performed: anesthesiologist  Anesthesiologist: Cecile Hearing, MD Preanesthetic Checklist Completed: patient identified, IV checked, risks and benefits discussed, surgical consent, monitors and equipment checked, pre-op evaluation and timeout performed Spinal Block Patient position: sitting Prep: DuraPrep and site prepped and draped Patient monitoring: continuous pulse ox and blood pressure Approach: midline Location: L3-4 Injection technique: single-shot Needle Needle type: Pencan  Needle gauge: 24 G Additional Notes Functioning IV was confirmed and monitors were applied. Sterile prep and drape, including hand hygiene, mask and sterile gloves were used. The patient was positioned and the spine was prepped. The skin was anesthetized with lidocaine.  Free flow of clear CSF was obtained prior to injecting local anesthetic into the CSF.  The spinal needle aspirated freely following injection.  The needle was carefully withdrawn.  The patient tolerated the procedure well. Consent was obtained prior to procedure with all questions answered and concerns addressed. Risks including but not limited to bleeding, infection, nerve damage, paralysis, failed block, inadequate analgesia, allergic reaction, high spinal, itching and headache were discussed and the patient wished to proceed.   Kaitlyn Aran, MD

## 2020-09-24 NOTE — Progress Notes (Signed)
Physical Therapy Treatment Patient Details Name: [name] MRN: [id number] DOB: 1946-05-06 Today's Date: 09/24/2020    History of Present Illness s/p L TKA. PMH: HTN    PT Comments    Pt continues to have ~ 15 degree L quad lag. Placed in [place] and reviewed skin checks, reasoning for KI, correct placement and fit with pt and brother. Pt with initial knee buckling however improved with cues for knee extension and with distance. Reviewed use of gait belt with pt and brother for safety. No L knee buckling noted during stair training or after ~ 8' amb. Recommend KI, RW use and supervision/assist for all mobility until pt is deemed safe to mobilize alone per HHPT. Discussed with RN as well.   Follow Up Recommendations  Follow surgeon's recommendation for DC plan and follow-up therapies, 24 hour assist      Equipment Recommendations  Rolling walker with 5" wheels    Recommendations for Other Services       Precautions / Restrictions Precautions Precautions: Fall;Knee Restrictions Weight Bearing Restrictions: No Other Position/Activity Restrictions: WBAT    Mobility  Bed Mobility Overal bed mobility: Needs Assistance Bed Mobility: Supine to Sit;Sit to Supine     Supine to sit: Min guard Sit to supine: Min guard   General bed mobility comments: for safety, cues for technique  Transfers Overall transfer level: Needs assistance Equipment used: Rolling walker (2 wheeled) Transfers: Sit to/from Stand Sit to Stand: Min assist         General transfer comment: cues for hand placement, assist to safely rise and transition to RW  Ambulation/Gait   Gait Distance (Feet): 100 Feet Assistive device: Rolling walker (2 wheeled) Gait Pattern/deviations: Step-to pattern;Decreased stance time - left     General Gait Details: cues for sequence, correct RW position, use of UEs as needed to un-weight LLE.  knee initially with slight buckling with KI in place, improved with  cues to extend L knee and with distance   Stairs Stairs: Yes Stairs assistance: Min assist Stair Management: Two rails;Step to pattern Number of Stairs: 3 General stair comments: cues for sequence and safety   Wheelchair Mobility    Modified Rankin (Stroke Patients Only)       Balance Overall balance assessment: Needs assistance Sitting-balance support: Feet supported;No upper extremity supported Sitting balance-Leahy Scale: Fair       Standing balance-Leahy Scale: Poor Standing balance comment: reliant on UEs for safe standing                            Cognition Arousal/Alertness: Awake/alert Behavior During Therapy: WFL for tasks assessed/performed Overall Cognitive Status: Within Functional Limits for tasks assessed                                        Exercises Total Joint Exercises Ankle Circles/Pumps: AROM;10 reps Quad Sets: AROM;10 reps Heel Slides: 10 reps;AAROM;Left Hip ABduction/ADduction: AAROM;5 reps Straight Leg Raises: AAROM;5 reps;Left    General Comments        Pertinent Vitals/Pain Pain Assessment: 0-10 Pain Score: 3  Pain Location: L knee Pain Descriptors / Indicators: Aching;Grimacing;Discomfort Pain Intervention(s): Limited activity within patient's tolerance;Monitored during session;Repositioned    Home Living Family/patient expects to be discharged to:: Private residence Living Arrangements: Other relatives (brother for 2 wks) Available Help at Discharge: Family;Available 24 hours/day Type of  Home: House Home Access: Stairs to enter Entrance Stairs-Rails: Right;Left;Can reach both Home Layout: One level Home Equipment: Cane - single point;Bedside commode      Prior Function Level of Independence: Independent          PT Goals (current goals can now be found in the care plan section) Acute Rehab PT Goals Patient Stated Goal: home today PT Goal Formulation: All assessment and education complete,  DC therapy (for this venue) Time For Goal Achievement: 10/01/20 Potential to Achieve Goals: Good Progress towards PT goals: Goals met/education completed, patient discharged from PT    Frequency    7X/week      PT Plan Current plan remains appropriate    Co-evaluation              AM-PAC PT "6 Clicks" Mobility   Outcome Measure  Help needed turning from your back to your side while in a flat bed without using bedrails?: A Little Help needed moving from lying on your back to sitting on the side of a flat bed without using bedrails?: A Little Help needed moving to and from a bed to a chair (including a wheelchair)?: A Little Help needed standing up from a chair using your arms (e.g., wheelchair or bedside chair)?: A Little Help needed to walk in hospital room?: A Little Help needed climbing 3-5 steps with a railing? : A Little 6 Click Score: 18    End of Session Equipment Utilized During Treatment: Gait belt;Left knee immobilizer Activity Tolerance: Patient tolerated treatment well Patient left: in bed;with call bell/phone within reach;with family/visitor present Nurse Communication: Mobility status PT Visit Diagnosis: Difficulty in walking, not elsewhere classified (R26.2)     Time: 1102-1117 (and 3567-0141) PT Time Calculation (min) (ACUTE ONLY): 38 min  Charges:  $Gait Training: 23-37 mins $Self Care/Home Management: 8-22                     Baxter Flattery, PT  Acute Rehab Dept ([place]) [phone] Pager [phone]  09/24/2020    [facility] 09/24/2020, 4:16 PM

## 2020-09-25 ENCOUNTER — Encounter (HOSPITAL_COMMUNITY): Payer: Self-pay | Admitting: Orthopedic Surgery

## 2020-09-25 DIAGNOSIS — R7303 Prediabetes: Secondary | ICD-10-CM | POA: Diagnosis not present

## 2020-09-25 DIAGNOSIS — Z471 Aftercare following joint replacement surgery: Secondary | ICD-10-CM | POA: Diagnosis not present

## 2020-09-25 DIAGNOSIS — Z79891 Long term (current) use of opiate analgesic: Secondary | ICD-10-CM | POA: Diagnosis not present

## 2020-09-25 DIAGNOSIS — Z96652 Presence of left artificial knee joint: Secondary | ICD-10-CM | POA: Diagnosis not present

## 2020-09-25 DIAGNOSIS — M1711 Unilateral primary osteoarthritis, right knee: Secondary | ICD-10-CM | POA: Diagnosis not present

## 2020-09-25 DIAGNOSIS — I10 Essential (primary) hypertension: Secondary | ICD-10-CM | POA: Diagnosis not present

## 2020-09-25 DIAGNOSIS — Z7982 Long term (current) use of aspirin: Secondary | ICD-10-CM | POA: Diagnosis not present

## 2020-09-25 DIAGNOSIS — D649 Anemia, unspecified: Secondary | ICD-10-CM | POA: Diagnosis not present

## 2020-09-25 DIAGNOSIS — Z87891 Personal history of nicotine dependence: Secondary | ICD-10-CM | POA: Diagnosis not present

## 2020-09-27 DIAGNOSIS — D649 Anemia, unspecified: Secondary | ICD-10-CM | POA: Diagnosis not present

## 2020-09-27 DIAGNOSIS — Z7982 Long term (current) use of aspirin: Secondary | ICD-10-CM | POA: Diagnosis not present

## 2020-09-27 DIAGNOSIS — Z79891 Long term (current) use of opiate analgesic: Secondary | ICD-10-CM | POA: Diagnosis not present

## 2020-09-27 DIAGNOSIS — Z87891 Personal history of nicotine dependence: Secondary | ICD-10-CM | POA: Diagnosis not present

## 2020-09-27 DIAGNOSIS — Z96652 Presence of left artificial knee joint: Secondary | ICD-10-CM | POA: Diagnosis not present

## 2020-09-27 DIAGNOSIS — Z471 Aftercare following joint replacement surgery: Secondary | ICD-10-CM | POA: Diagnosis not present

## 2020-09-27 DIAGNOSIS — M1711 Unilateral primary osteoarthritis, right knee: Secondary | ICD-10-CM | POA: Diagnosis not present

## 2020-09-27 DIAGNOSIS — I10 Essential (primary) hypertension: Secondary | ICD-10-CM | POA: Diagnosis not present

## 2020-09-27 DIAGNOSIS — R7303 Prediabetes: Secondary | ICD-10-CM | POA: Diagnosis not present

## 2020-09-28 DIAGNOSIS — Z96652 Presence of left artificial knee joint: Secondary | ICD-10-CM | POA: Diagnosis not present

## 2020-09-28 DIAGNOSIS — I10 Essential (primary) hypertension: Secondary | ICD-10-CM | POA: Diagnosis not present

## 2020-09-28 DIAGNOSIS — Z87891 Personal history of nicotine dependence: Secondary | ICD-10-CM | POA: Diagnosis not present

## 2020-09-28 DIAGNOSIS — Z79891 Long term (current) use of opiate analgesic: Secondary | ICD-10-CM | POA: Diagnosis not present

## 2020-09-28 DIAGNOSIS — Z471 Aftercare following joint replacement surgery: Secondary | ICD-10-CM | POA: Diagnosis not present

## 2020-09-28 DIAGNOSIS — Z7982 Long term (current) use of aspirin: Secondary | ICD-10-CM | POA: Diagnosis not present

## 2020-09-28 DIAGNOSIS — R7303 Prediabetes: Secondary | ICD-10-CM | POA: Diagnosis not present

## 2020-09-28 DIAGNOSIS — D649 Anemia, unspecified: Secondary | ICD-10-CM | POA: Diagnosis not present

## 2020-09-28 DIAGNOSIS — M1711 Unilateral primary osteoarthritis, right knee: Secondary | ICD-10-CM | POA: Diagnosis not present

## 2020-10-01 DIAGNOSIS — Z79891 Long term (current) use of opiate analgesic: Secondary | ICD-10-CM | POA: Diagnosis not present

## 2020-10-01 DIAGNOSIS — Z471 Aftercare following joint replacement surgery: Secondary | ICD-10-CM | POA: Diagnosis not present

## 2020-10-01 DIAGNOSIS — Z87891 Personal history of nicotine dependence: Secondary | ICD-10-CM | POA: Diagnosis not present

## 2020-10-01 DIAGNOSIS — Z96652 Presence of left artificial knee joint: Secondary | ICD-10-CM | POA: Diagnosis not present

## 2020-10-01 DIAGNOSIS — D649 Anemia, unspecified: Secondary | ICD-10-CM | POA: Diagnosis not present

## 2020-10-01 DIAGNOSIS — Z7982 Long term (current) use of aspirin: Secondary | ICD-10-CM | POA: Diagnosis not present

## 2020-10-01 DIAGNOSIS — I10 Essential (primary) hypertension: Secondary | ICD-10-CM | POA: Diagnosis not present

## 2020-10-01 DIAGNOSIS — M1711 Unilateral primary osteoarthritis, right knee: Secondary | ICD-10-CM | POA: Diagnosis not present

## 2020-10-01 DIAGNOSIS — R7303 Prediabetes: Secondary | ICD-10-CM | POA: Diagnosis not present

## 2020-10-02 DIAGNOSIS — Z471 Aftercare following joint replacement surgery: Secondary | ICD-10-CM | POA: Diagnosis not present

## 2020-10-02 DIAGNOSIS — I10 Essential (primary) hypertension: Secondary | ICD-10-CM | POA: Diagnosis not present

## 2020-10-02 DIAGNOSIS — Z7982 Long term (current) use of aspirin: Secondary | ICD-10-CM | POA: Diagnosis not present

## 2020-10-02 DIAGNOSIS — Z87891 Personal history of nicotine dependence: Secondary | ICD-10-CM | POA: Diagnosis not present

## 2020-10-02 DIAGNOSIS — M1711 Unilateral primary osteoarthritis, right knee: Secondary | ICD-10-CM | POA: Diagnosis not present

## 2020-10-02 DIAGNOSIS — R7303 Prediabetes: Secondary | ICD-10-CM | POA: Diagnosis not present

## 2020-10-02 DIAGNOSIS — Z96652 Presence of left artificial knee joint: Secondary | ICD-10-CM | POA: Diagnosis not present

## 2020-10-02 DIAGNOSIS — D649 Anemia, unspecified: Secondary | ICD-10-CM | POA: Diagnosis not present

## 2020-10-02 DIAGNOSIS — Z79891 Long term (current) use of opiate analgesic: Secondary | ICD-10-CM | POA: Diagnosis not present

## 2020-10-04 DIAGNOSIS — M25562 Pain in left knee: Secondary | ICD-10-CM | POA: Diagnosis not present

## 2020-10-04 DIAGNOSIS — Z96651 Presence of right artificial knee joint: Secondary | ICD-10-CM | POA: Diagnosis not present

## 2020-10-04 DIAGNOSIS — Z9889 Other specified postprocedural states: Secondary | ICD-10-CM | POA: Diagnosis not present

## 2020-10-08 DIAGNOSIS — Z96651 Presence of right artificial knee joint: Secondary | ICD-10-CM | POA: Diagnosis not present

## 2020-10-08 DIAGNOSIS — M25562 Pain in left knee: Secondary | ICD-10-CM | POA: Diagnosis not present

## 2020-10-15 DIAGNOSIS — Z96651 Presence of right artificial knee joint: Secondary | ICD-10-CM | POA: Diagnosis not present

## 2020-10-15 DIAGNOSIS — M25562 Pain in left knee: Secondary | ICD-10-CM | POA: Diagnosis not present

## 2020-10-18 DIAGNOSIS — Z96651 Presence of right artificial knee joint: Secondary | ICD-10-CM | POA: Diagnosis not present

## 2020-10-18 DIAGNOSIS — M25562 Pain in left knee: Secondary | ICD-10-CM | POA: Diagnosis not present

## 2020-10-22 DIAGNOSIS — Z96651 Presence of right artificial knee joint: Secondary | ICD-10-CM | POA: Diagnosis not present

## 2020-10-22 DIAGNOSIS — M25562 Pain in left knee: Secondary | ICD-10-CM | POA: Diagnosis not present

## 2020-10-30 DIAGNOSIS — M25562 Pain in left knee: Secondary | ICD-10-CM | POA: Diagnosis not present

## 2020-10-30 DIAGNOSIS — Z96651 Presence of right artificial knee joint: Secondary | ICD-10-CM | POA: Diagnosis not present

## 2020-11-01 DIAGNOSIS — M25562 Pain in left knee: Secondary | ICD-10-CM | POA: Diagnosis not present

## 2020-11-01 DIAGNOSIS — Z96651 Presence of right artificial knee joint: Secondary | ICD-10-CM | POA: Diagnosis not present

## 2020-11-13 DIAGNOSIS — M25562 Pain in left knee: Secondary | ICD-10-CM | POA: Diagnosis not present

## 2020-11-13 DIAGNOSIS — Z96651 Presence of right artificial knee joint: Secondary | ICD-10-CM | POA: Diagnosis not present

## 2020-11-15 DIAGNOSIS — Z96651 Presence of right artificial knee joint: Secondary | ICD-10-CM | POA: Diagnosis not present

## 2020-11-15 DIAGNOSIS — M25562 Pain in left knee: Secondary | ICD-10-CM | POA: Diagnosis not present

## 2020-11-20 DIAGNOSIS — M25562 Pain in left knee: Secondary | ICD-10-CM | POA: Diagnosis not present

## 2020-11-20 DIAGNOSIS — Z96651 Presence of right artificial knee joint: Secondary | ICD-10-CM | POA: Diagnosis not present

## 2020-11-22 ENCOUNTER — Encounter: Payer: Self-pay | Admitting: *Deleted

## 2020-11-22 DIAGNOSIS — Z96651 Presence of right artificial knee joint: Secondary | ICD-10-CM | POA: Diagnosis not present

## 2020-11-22 DIAGNOSIS — M25562 Pain in left knee: Secondary | ICD-10-CM | POA: Diagnosis not present

## 2020-11-23 ENCOUNTER — Ambulatory Visit: Payer: Medicare Other | Admitting: Cardiology

## 2020-11-23 ENCOUNTER — Encounter: Payer: Self-pay | Admitting: Cardiology

## 2020-11-23 ENCOUNTER — Other Ambulatory Visit: Payer: Self-pay

## 2020-11-23 VITALS — BP 120/52 | HR 81 | Resp 16 | Ht 61.0 in | Wt 157.8 lb

## 2020-11-23 DIAGNOSIS — I4510 Unspecified right bundle-branch block: Secondary | ICD-10-CM | POA: Diagnosis not present

## 2020-11-23 DIAGNOSIS — I10 Essential (primary) hypertension: Secondary | ICD-10-CM | POA: Diagnosis not present

## 2020-11-23 DIAGNOSIS — Z8679 Personal history of other diseases of the circulatory system: Secondary | ICD-10-CM

## 2020-11-23 NOTE — Addendum Note (Signed)
Addended by: Burman Nieves T on: 11/23/2020 03:53 PM   Modules accepted: Orders

## 2020-11-23 NOTE — Patient Instructions (Addendum)

## 2020-11-23 NOTE — Progress Notes (Signed)
Cardiology Office Note  Date: 11/23/2020   ID: [name], DOB Feb 25, 1946, MRN [id number]  PCP:  Kaitlyn Stabile, MD  Cardiologist:  Kaitlyn Dell, MD Electrophysiologist:  None   Chief Complaint  Patient presents with  . New Patient (Initial Visit)    RBBB    History of Present Illness: [name] is a 74 y.o. female referred for cardiology consultation by Ms. Kaitlyn Morton NP due to finding of right bundle branch block by ECG.  She had a routine tracing done back in September as part of a preoperative assessment prior to left total knee arthroplasty which she ultimately underwent without incident in October.  She had spinal anesthesia.  She is still in the process of physical therapy, doing very well and anticipates having her right knee replaced ultimately.  She is not aware of any prior specific cardiac history other than what sounds like PSVT diagnosed back in 2008.  She reports sudden onset of palpitations at that time at which point she was started on beta-blocker after EMS and ER assessment.  She has done well since then.  No known history of ischemic heart disease or structural heart disease.  I personally reviewed the ECG from September 28 which shows normal sinus rhythm with right bundle branch block.  Follow-up tracing today shows no changes.  This may well just be a normal variant.  Unfortunately, there are no old tracings for review.  At baseline she does not describe any substantial shortness of breath beyond NYHA class II, no exertional chest pain.  No sudden syncope.  Past Medical History:  Diagnosis Date  . Anemia   . Anxiety   . Arthritis   . Essential hypertension   . Hyperlipidemia   . Pre-diabetes     Past Surgical History:  Procedure Laterality Date  . ABDOMINAL HYSTERECTOMY    . BREAST CYST EXCISION Right    x2  . COLONOSCOPY    . COLONOSCOPY N/A 11/28/2015   Procedure: COLONOSCOPY;  Surgeon: Kaitlyn Ade, MD;  [facility];  Service: Endoscopy;  Laterality: N/A;  1:00 PM  . Right  breast lumpectomy  1975   benign  . TOTAL KNEE ARTHROPLASTY Left 09/24/2020   Procedure: LEFT TOTAL KNEE ARTHROPLASTY;  Surgeon: Kaitlyn Birchwood, MD;  Location: [facility];  Service: Orthopedics;  Laterality: Left;    Current Outpatient Medications  Medication Sig Dispense Refill  . amLODipine (NORVASC) 10 MG tablet Take 10 mg by mouth daily.    Marland Kitchen aspirin EC 81 MG tablet Take 1 tablet (81 mg total) by mouth daily. 30 tablet 2  . Black Pepper-Turmeric (TURMERIC CURCUMIN) 04-999 MG CAPS Take 1 capsule by mouth daily.     . Calcium Carbonate-Vit D-Min (CALTRATE 600+D PLUS PO) Take 1 tablet by mouth daily.    . clorazepate (TRANXENE) 7.5 MG tablet Take 7.5 mg by mouth daily as needed for anxiety.     . diclofenac Sodium (VOLTAREN) 1 % GEL Apply 2 g topically 2 (two) times daily as needed (pain).    . ferrous sulfate 325 (65 FE) MG tablet Take 325 mg by mouth daily with breakfast.    . furosemide (LASIX) 20 MG tablet Take 20 mg by mouth daily as needed for fluid.     Marland Kitchen glucosamine-chondroitin 500-400 MG tablet Take 1 tablet by mouth daily.     . metoprolol succinate (TOPROL-XL) 50 MG 24 hr tablet Take 50 mg by mouth daily.    Marland Kitchen  Multiple Vitamins-Minerals (CENTRUM SILVER PO) Take 1 tablet by mouth daily.     . naproxen (NAPROSYN) 500 MG tablet Take 1 tablet (500 mg total) by mouth 2 (two) times daily with a meal. (Patient taking differently: Take 500 mg by mouth 2 (two) times daily as needed for moderate pain.) 60 tablet 5  . traMADol (ULTRAM) 50 MG tablet Take 50 mg by mouth every 6 (six) hours as needed for moderate pain.     Marland Kitchen trolamine salicylate (ASPERCREME) 10 % cream Apply 1 application topically as needed for muscle pain.     No current facility-administered medications for this visit.   Allergies:  Patient has no known allergies.   Social History: The patient  reports that she quit smoking about 10 years ago. Her smoking use  included cigarettes. She has a 10.00 pack-year smoking history. She has never used smokeless tobacco. She reports current alcohol use. She reports that she does not use drugs.   Family History: The patient's family history includes CAD in her father; Hypertension in her father and mother; Lung cancer in her mother; Prostate cancer in her father.   ROS: No syncope.  Physical Exam: VS:  BP (!) 120/52   Pulse 81   Resp 16   Ht 5\' 1"  (1.549 m)   Wt 157 lb 12.8 oz (71.6 kg)   SpO2 99%   BMI 29.82 kg/m , BMI Body mass index is 29.82 kg/m.  Wt Readings from Last 3 Encounters:  11/23/20 157 lb 12.8 oz (71.6 kg)  09/24/20 159 lb (72.1 kg)  09/12/20 159 lb (72.1 kg)    General: Pleasant elderly woman, appears comfortable at rest. HEENT: Conjunctiva and lids normal, wearing a mask. Neck: Supple, no elevated JVP or carotid bruits, no thyromegaly. Lungs: Clear to auscultation, nonlabored breathing at rest. Cardiac: Regular rate and rhythm, no S3, very soft systolic murmur, no pericardial rub. Extremities: No pitting edema, distal pulses 2+.  Left anterior knee scar well-healed. Skin: Warm and dry. Musculoskeletal: No kyphosis. Neuropsychiatric: Alert and oriented x3, affect grossly appropriate.  ECG:  No old tracings for review.  Recent Labwork: 09/12/2020: BUN 15; Creatinine, Ser 0.81; Hemoglobin 12.6; Platelets 188; Potassium 4.4; Sodium 139  September 2021: Hemoglobin 12.3, platelets 183, BUN 15, creatinine 0.68, potassium 4.3, AST 16, ALT 12, cholesterol 204, triglycerides 78, HDL 67, LDL 123, hemoglobin A1c 6.6%  Other Studies Reviewed Today:  Chest x-ray 09/12/2020: FINDINGS: The heart size and mediastinal contours are within normal limits. Similar-appearing aortic arch calcifications.  Biapical pleural/pulmonary scarring. No focal consolidation. No pulmonary edema. No pleural effusion. No pneumothorax.  No acute osseous abnormality. Abdominal aorta  atherosclerotic plaque.  IMPRESSION: No active cardiopulmonary disease.  Assessment and Plan:  1.  Right bundle branch block by ECG, may well be a normal variant, however there are no more remote tracings available for review.  She does not describe any clear-cut functional limitations beyond bilateral arthritic knee pain, better at this point after left total knee arthroplasty in October.  She continues with physical therapy.  Plan is to obtain an echocardiogram to ensure structurally normal heart, if that is the case no further cardiac work-up is necessary at this time.  2.  Remote history of PSVT based on patient provided history today.  She has been on Toprol-XL without any prolonged palpitations over the last several years.  Would continue strategy of observation.  3.  Essential hypertension, on Norvasc along with Toprol-XL.  Blood pressure is well controlled today.  Medication Adjustments/Labs and Tests Ordered: Current medicines are reviewed at length with the patient today.  Concerns regarding medicines are outlined above.   Tests Ordered: Orders Placed This Encounter  Procedures  . ECHOCARDIOGRAM COMPLETE    Medication Changes: No orders of the defined types were placed in this encounter.   Disposition:  Follow up test results.  Signed, Jonelle Sidle, MD, [facility] 11/23/2020 1:50 PM    [facility] at [facility] [address] [place], [place], [place] [address] Phone: [phone]; Fax: [phone]

## 2020-11-27 DIAGNOSIS — Z96651 Presence of right artificial knee joint: Secondary | ICD-10-CM | POA: Diagnosis not present

## 2020-11-27 DIAGNOSIS — M25562 Pain in left knee: Secondary | ICD-10-CM | POA: Diagnosis not present

## 2020-11-28 ENCOUNTER — Other Ambulatory Visit: Payer: Self-pay | Admitting: Cardiology

## 2020-11-28 ENCOUNTER — Ambulatory Visit (INDEPENDENT_AMBULATORY_CARE_PROVIDER_SITE_OTHER): Payer: Medicare Other

## 2020-11-28 DIAGNOSIS — I4510 Unspecified right bundle-branch block: Secondary | ICD-10-CM

## 2020-11-28 DIAGNOSIS — I471 Supraventricular tachycardia: Secondary | ICD-10-CM

## 2020-11-28 LAB — ECHOCARDIOGRAM COMPLETE
Area-P 1/2: 2.76 cm2
Calc EF: 62.4 %
MV M vel: 2.31 m/s
MV Peak grad: 21.3 mmHg
S' Lateral: 2 cm
Single Plane A2C EF: 58.6 %
Single Plane A4C EF: 66.1 %

## 2020-11-29 ENCOUNTER — Telehealth: Payer: Self-pay | Admitting: *Deleted

## 2020-11-29 NOTE — Telephone Encounter (Signed)
-----   Message from Jonelle Sidle, MD sent at 11/28/2020 12:15 PM EST ----- Results reviewed.  Reassuring, LVEF normal at 60 to 65%, no major valvular abnormalities.  Please schedule a 1 year follow-up.

## 2020-11-29 NOTE — Telephone Encounter (Signed)
Patient informed. Copy sent to PCP °

## 2020-12-03 DIAGNOSIS — E119 Type 2 diabetes mellitus without complications: Secondary | ICD-10-CM | POA: Diagnosis not present

## 2020-12-04 DIAGNOSIS — M25562 Pain in left knee: Secondary | ICD-10-CM | POA: Diagnosis not present

## 2020-12-04 DIAGNOSIS — Z96651 Presence of right artificial knee joint: Secondary | ICD-10-CM | POA: Diagnosis not present

## 2020-12-06 DIAGNOSIS — Z96651 Presence of right artificial knee joint: Secondary | ICD-10-CM | POA: Diagnosis not present

## 2020-12-06 DIAGNOSIS — M25562 Pain in left knee: Secondary | ICD-10-CM | POA: Diagnosis not present

## 2020-12-13 DIAGNOSIS — Z96651 Presence of right artificial knee joint: Secondary | ICD-10-CM | POA: Diagnosis not present

## 2020-12-13 DIAGNOSIS — M25562 Pain in left knee: Secondary | ICD-10-CM | POA: Diagnosis not present

## 2020-12-25 DIAGNOSIS — M25562 Pain in left knee: Secondary | ICD-10-CM | POA: Diagnosis not present

## 2020-12-25 DIAGNOSIS — Z96651 Presence of right artificial knee joint: Secondary | ICD-10-CM | POA: Diagnosis not present

## 2021-01-11 DIAGNOSIS — E1165 Type 2 diabetes mellitus with hyperglycemia: Secondary | ICD-10-CM | POA: Diagnosis not present

## 2021-01-11 DIAGNOSIS — G4700 Insomnia, unspecified: Secondary | ICD-10-CM | POA: Diagnosis not present

## 2021-01-11 DIAGNOSIS — Z0000 Encounter for general adult medical examination without abnormal findings: Secondary | ICD-10-CM | POA: Diagnosis not present

## 2021-01-11 DIAGNOSIS — Z23 Encounter for immunization: Secondary | ICD-10-CM | POA: Diagnosis not present

## 2021-01-11 DIAGNOSIS — I10 Essential (primary) hypertension: Secondary | ICD-10-CM | POA: Diagnosis not present

## 2021-01-12 DIAGNOSIS — E119 Type 2 diabetes mellitus without complications: Secondary | ICD-10-CM | POA: Diagnosis not present

## 2021-01-12 DIAGNOSIS — G4700 Insomnia, unspecified: Secondary | ICD-10-CM | POA: Diagnosis not present

## 2021-01-12 DIAGNOSIS — E1165 Type 2 diabetes mellitus with hyperglycemia: Secondary | ICD-10-CM | POA: Diagnosis not present

## 2021-01-12 DIAGNOSIS — I10 Essential (primary) hypertension: Secondary | ICD-10-CM | POA: Diagnosis not present

## 2021-01-16 DIAGNOSIS — M170 Bilateral primary osteoarthritis of knee: Secondary | ICD-10-CM | POA: Diagnosis not present

## 2021-01-16 DIAGNOSIS — R600 Localized edema: Secondary | ICD-10-CM | POA: Diagnosis not present

## 2021-01-16 DIAGNOSIS — E1165 Type 2 diabetes mellitus with hyperglycemia: Secondary | ICD-10-CM | POA: Diagnosis not present

## 2021-01-16 DIAGNOSIS — I4510 Unspecified right bundle-branch block: Secondary | ICD-10-CM | POA: Diagnosis not present

## 2021-01-16 DIAGNOSIS — E782 Mixed hyperlipidemia: Secondary | ICD-10-CM | POA: Diagnosis not present

## 2021-01-16 DIAGNOSIS — I10 Essential (primary) hypertension: Secondary | ICD-10-CM | POA: Diagnosis not present

## 2021-01-16 DIAGNOSIS — F5101 Primary insomnia: Secondary | ICD-10-CM | POA: Diagnosis not present

## 2021-01-16 DIAGNOSIS — M25561 Pain in right knee: Secondary | ICD-10-CM | POA: Diagnosis not present

## 2021-02-11 DIAGNOSIS — E1165 Type 2 diabetes mellitus with hyperglycemia: Secondary | ICD-10-CM | POA: Diagnosis not present

## 2021-02-11 DIAGNOSIS — G4700 Insomnia, unspecified: Secondary | ICD-10-CM | POA: Diagnosis not present

## 2021-02-11 DIAGNOSIS — I10 Essential (primary) hypertension: Secondary | ICD-10-CM | POA: Diagnosis not present

## 2021-02-26 DIAGNOSIS — M1711 Unilateral primary osteoarthritis, right knee: Secondary | ICD-10-CM | POA: Diagnosis not present

## 2021-02-26 DIAGNOSIS — Z96652 Presence of left artificial knee joint: Secondary | ICD-10-CM | POA: Diagnosis not present

## 2021-03-13 DIAGNOSIS — E1165 Type 2 diabetes mellitus with hyperglycemia: Secondary | ICD-10-CM | POA: Diagnosis not present

## 2021-03-13 DIAGNOSIS — I10 Essential (primary) hypertension: Secondary | ICD-10-CM | POA: Diagnosis not present

## 2021-04-14 DIAGNOSIS — E1165 Type 2 diabetes mellitus with hyperglycemia: Secondary | ICD-10-CM | POA: Diagnosis not present

## 2021-04-14 DIAGNOSIS — I10 Essential (primary) hypertension: Secondary | ICD-10-CM | POA: Diagnosis not present

## 2021-04-18 DIAGNOSIS — M170 Bilateral primary osteoarthritis of knee: Secondary | ICD-10-CM | POA: Diagnosis not present

## 2021-04-18 DIAGNOSIS — M545 Low back pain, unspecified: Secondary | ICD-10-CM | POA: Diagnosis not present

## 2021-06-11 DIAGNOSIS — M1711 Unilateral primary osteoarthritis, right knee: Secondary | ICD-10-CM | POA: Diagnosis not present

## 2021-06-13 DIAGNOSIS — E1165 Type 2 diabetes mellitus with hyperglycemia: Secondary | ICD-10-CM | POA: Diagnosis not present

## 2021-06-13 DIAGNOSIS — I10 Essential (primary) hypertension: Secondary | ICD-10-CM | POA: Diagnosis not present

## 2021-06-18 DIAGNOSIS — I10 Essential (primary) hypertension: Secondary | ICD-10-CM | POA: Diagnosis not present

## 2021-06-18 DIAGNOSIS — E1165 Type 2 diabetes mellitus with hyperglycemia: Secondary | ICD-10-CM | POA: Diagnosis not present

## 2021-06-18 DIAGNOSIS — E782 Mixed hyperlipidemia: Secondary | ICD-10-CM | POA: Diagnosis not present

## 2021-06-25 DIAGNOSIS — M545 Low back pain, unspecified: Secondary | ICD-10-CM | POA: Diagnosis not present

## 2021-06-25 DIAGNOSIS — Z0001 Encounter for general adult medical examination with abnormal findings: Secondary | ICD-10-CM | POA: Diagnosis not present

## 2021-06-25 DIAGNOSIS — R600 Localized edema: Secondary | ICD-10-CM | POA: Diagnosis not present

## 2021-06-25 DIAGNOSIS — E782 Mixed hyperlipidemia: Secondary | ICD-10-CM | POA: Diagnosis not present

## 2021-06-25 DIAGNOSIS — F5101 Primary insomnia: Secondary | ICD-10-CM | POA: Diagnosis not present

## 2021-06-25 DIAGNOSIS — Z0000 Encounter for general adult medical examination without abnormal findings: Secondary | ICD-10-CM | POA: Diagnosis not present

## 2021-06-25 DIAGNOSIS — M170 Bilateral primary osteoarthritis of knee: Secondary | ICD-10-CM | POA: Diagnosis not present

## 2021-07-10 DIAGNOSIS — M1711 Unilateral primary osteoarthritis, right knee: Secondary | ICD-10-CM | POA: Diagnosis not present

## 2021-07-10 DIAGNOSIS — Z01812 Encounter for preprocedural laboratory examination: Secondary | ICD-10-CM | POA: Diagnosis not present

## 2021-07-11 DIAGNOSIS — M25661 Stiffness of right knee, not elsewhere classified: Secondary | ICD-10-CM | POA: Diagnosis not present

## 2021-07-11 DIAGNOSIS — M1711 Unilateral primary osteoarthritis, right knee: Secondary | ICD-10-CM | POA: Diagnosis not present

## 2021-07-19 DIAGNOSIS — G8918 Other acute postprocedural pain: Secondary | ICD-10-CM | POA: Diagnosis not present

## 2021-07-19 DIAGNOSIS — M1711 Unilateral primary osteoarthritis, right knee: Secondary | ICD-10-CM | POA: Diagnosis not present

## 2021-07-19 DIAGNOSIS — M21161 Varus deformity, not elsewhere classified, right knee: Secondary | ICD-10-CM | POA: Diagnosis not present

## 2021-07-22 ENCOUNTER — Ambulatory Visit (HOSPITAL_COMMUNITY): Payer: Medicare Other | Attending: Orthopedic Surgery | Admitting: Physical Therapy

## 2021-07-22 ENCOUNTER — Other Ambulatory Visit: Payer: Self-pay

## 2021-07-22 DIAGNOSIS — M25561 Pain in right knee: Secondary | ICD-10-CM | POA: Diagnosis not present

## 2021-07-22 DIAGNOSIS — R262 Difficulty in walking, not elsewhere classified: Secondary | ICD-10-CM

## 2021-07-22 DIAGNOSIS — M6281 Muscle weakness (generalized): Secondary | ICD-10-CM | POA: Diagnosis not present

## 2021-07-22 DIAGNOSIS — G8929 Other chronic pain: Secondary | ICD-10-CM | POA: Insufficient documentation

## 2021-07-22 DIAGNOSIS — M25661 Stiffness of right knee, not elsewhere classified: Secondary | ICD-10-CM | POA: Insufficient documentation

## 2021-07-22 NOTE — Therapy (Signed)
[facility] [address] [place], [place], [address] Phone: [phone]   Fax:  [phone]  Physical Therapy Evaluation  Patient Details  Name: [name] MRN: [id number] Date of Birth: [birth year] Referring Provider (PT): Gean BirchwoodFrank Rowan MD   Encounter Date: 07/22/2021   PT End of Session - 07/22/21 1545     Visit Number 1    Number of Visits 18    Date for PT Re-Evaluation 09/02/21    Authorization Type UHC medicare, visit based on medical necessity follow MCR guidelines    Progress Note Due on Visit 10    PT Start Time 1403    PT Stop Time 1433    PT Time Calculation (min) 30 min    Activity Tolerance Patient tolerated treatment well    Behavior [facility] for tasks assessed/performed             Past Medical History:  Diagnosis Date   Anemia    Anxiety    Arthritis    Essential hypertension    Hyperlipidemia    Pre-diabetes     Past Surgical History:  Procedure Laterality Date   ABDOMINAL HYSTERECTOMY     BREAST CYST EXCISION Right    x2   COLONOSCOPY     COLONOSCOPY N/A 11/28/2015   Procedure: COLONOSCOPY;  Surgeon: Corbin Adeobert M Rourk, MD;  [facility];  Service: Endoscopy;  Laterality: N/A;  1:00 PM   Right  breast lumpectomy  1975   benign   TOTAL KNEE ARTHROPLASTY Left 09/24/2020   Procedure: LEFT TOTAL KNEE ARTHROPLASTY;  Surgeon: Gean Birchwoodowan, Frank, MD;  [facility];  Service: Orthopedics;  Laterality: Left;    There were no vitals filed for this visit.    Subjective Assessment - 07/22/21 1539     Subjective States has had a right TKA on 07/19/21. She felt good the first day and when her nerve block wore off she started having some pain. States each day she sees some improvements. Reports she had her left knee replaced last year. States current pain is 8/10 and reported as dull in her right knee with movement or staying still. Reports that she had some pain prior to her surgery and  has been dealing for this for over 5 years.    Pertinent History previous left knee    Limitations Standing;Walking    Patient Stated Goals to be able to stay independent    Currently in Pain? Yes    Pain Score 8     Pain Location Leg    Pain Orientation Right    Pain Descriptors / Indicators Aching;Dull    Pain Type Surgical pain    Aggravating Factors  bending, standing, walking    Pain Relieving Factors rest, ice, elevation, medications                [facility] PT Assessment - 07/22/21 0001       Assessment   Medical Diagnosis R TKA    Referring Provider (PT) Gean BirchwoodFrank Rowan MD    Onset Date/Surgical Date 07/19/21    Next MD Visit 07/30/21    Prior Therapy yes for left knee replacement      Restrictions   Weight Bearing Restrictions --   WBAT     Balance Screen   Has the patient fallen in the past 6 months No      Home Environment   Living Environment Private residence    Living Arrangements Alone  Available Help at Discharge Family    Type of Home House    Home Access Stairs to enter    Entrance Stairs-Number of Steps 3    Entrance Stairs-Rails Right    Home Layout One level    Home Equipment Walker - 2 wheels;Cane - single point;Bedside commode;Grab bars - tub/shower;Grab bars - toilet      Prior Function   Level of Independence Independent   no AD   Vocation Retired      IT consultant   Overall Cognitive Status Within Functional Limits for tasks assessed      Observation/Other Assessments   Observations swelling thourhgout right leg, wearing bilateral ted hoes, previous bleeding noted on bandage but dried and no active bleeding noted.  Bruising thoruhgout right leg with mild redness noted alogn lateral side of right knee    Focus on Therapeutic Outcomes (FOTO)  22% function      ROM / Strength   AROM / PROM / Strength AROM;Strength      AROM   AROM Assessment Site Knee    Right/Left Knee Left;Right    Right Knee Extension 0    Right Knee Flexion 120     Left Knee Extension 8   lacking   Left Knee Flexion 76      Strength   Overall Strength Comments poor quad contraction noted on right -- uses strap to get in and out of bed to lift leg      Palpation   Patella mobility hypomobility noted    Palpation comment tenderness to palpation throughout right knee      Bed Mobility   Bed Mobility Supine to Sit;Sit to Supine    Supine to Sit --   uses strap to get in/out of bed with right leg   Sit to Supine --   uses strap to get in/out of bed with right leg     Transfers   Transfers Sit to Stand;Stand to Sit    Sit to Stand 6: Modified independent (Device/Increase time);With upper extremity assist    Stand to Sit 6: Modified independent (Device/Increase time);With upper extremity assist      Ambulation/Gait   Ambulation/Gait Yes    Ambulation/Gait Assistance 5: Supervision    Ambulation Distance (Feet) 174 Feet    Assistive device Rolling walker    Gait Pattern Step-to pattern;Decreased hip/knee flexion - right;Decreased weight shift to right;Decreased dorsiflexion - right;Right flexed knee in stance;Antalgic    Ambulation Surface Level;Indoor    Gait velocity reduced    Gait Comments                        Objective measurements completed on examination: See above findings.       [facility] Adult PT Treatment/Exercise - 07/22/21 0001       Exercises   Exercises Knee/Hip      Knee/Hip Exercises: Supine   Quad Sets 10 reps;Right    Heel Slides 10 reps;Right                    PT Education - 07/22/21 1612     Education Details on current presentation, elevation, edema management, HEP and POC    Person(s) Educated Patient    Methods Explanation    Comprehension Verbalized understanding              PT Short Term Goals - 07/22/21 1610       PT SHORT TERM GOAL #  1   Title Patient will demonstrate at leat 2-120 degrees of right knee ROM    Time 3    Period Weeks    Status New    Target Date  08/12/21      PT SHORT TERM GOAL #2   Title Patient will report at least 50% improvement in overall symptoms and/or function to demonstrate improved functional mobility    Time 3    Period Weeks    Status New    Target Date 08/12/21      PT SHORT TERM GOAL #3   Title Patient will be independent in self management strategies to improve quality of life and functional outcomes.    Time 3    Period Weeks    Status New    Target Date 08/12/21               PT Long Term Goals - 07/22/21 1610       PT LONG TERM GOAL #1   Title Patient will report at least 75% improvement in overall symptoms and/or function to demonstrate improved functional mobility    Time 6    Period Weeks    Status New    Target Date 09/02/21      PT LONG TERM GOAL #2   Title Patient will be able to walk at least 226 feet without AD in 2 minutes    Time 6    Period Weeks    Status New    Target Date 09/02/21      PT LONG TERM GOAL #3   Title Patient will improve on FOTO score to meet predicted outcomes to demonstrate improved functional mobility.    Time 6    Period Weeks    Status New    Target Date 09/02/21                    Plan - 07/22/21 1608     Clinical Impression Statement Patient is a 75 y.o. female who presents to physical therapy after right TKA on 07/17/21. Patient demonstrates decreased strength, ROM restriction, balance deficits and gait abnormalities which are likely contributing to symptoms of pain and are negatively impacting patient ability to perform ADLs and functional mobility tasks. Patient will benefit from skilled physical therapy services to address these deficits to reduce pain, improve level of function with ADLs, functional mobility tasks, and reduce risk for falls.    Personal Factors and Comorbidities Comorbidity 1    Comorbidities previous left TKA    Examination-Activity Limitations Bathing;Sleep;Squat;Stairs;Stand;Transfers;Hygiene/Grooming;Lift;Locomotion  Level;Dressing;Carry    Examination-Participation Restrictions Laundry;Meal Prep;Medication Management;Driving;Community Activity;Shop;Cleaning;Church    Stability/Clinical Decision Making Stable/Uncomplicated    Clinical Decision Making Low    Rehab Potential Good    PT Frequency 3x / week    PT Duration 6 weeks    PT Treatment/Interventions ADLs/Self Care Home Management;Aquatic Therapy;Cryotherapy;Electrical Stimulation;Moist Heat;Traction;Balance training;Therapeutic exercise;Manual techniques;Therapeutic activities;Gait training;Patient/family education;Neuromuscular re-education;Dry needling;Passive range of motion    PT Next Visit Plan quad strengthening, edema management, knee ROM    PT Home Exercise Plan heel slides, walking, quad isometric    Consulted and Agree with Plan of Care Patient             Patient will benefit from skilled therapeutic intervention in order to improve the following deficits and impairments:  Pain, Decreased strength, Decreased activity tolerance, Decreased balance, Difficulty walking, Decreased mobility, Decreased range of motion, Increased edema, Decreased skin integrity, Decreased endurance  Visit Diagnosis: Chronic pain  of right knee  Decreased range of motion (ROM) of right knee  Difficulty in walking, not elsewhere classified  Muscle weakness (generalized)     Problem List Patient Active Problem List   Diagnosis Date Noted   Tibial plateau fracture, left, closed, initial encounter 09/21/2020   Special screening for malignant neoplasms, colon    4:14 PM, 07/22/21 Tereasa Coop, DPT Physical Therapy with [facility]  [phone] office   [facility] [address] [place], [place], [address] Phone: [phone]   Fax:  [phone]  Name: [name] MRN: [id number] Date of Birth: 05/15/1946

## 2021-07-24 ENCOUNTER — Ambulatory Visit (HOSPITAL_COMMUNITY): Payer: Medicare Other | Admitting: Physical Therapy

## 2021-07-24 ENCOUNTER — Other Ambulatory Visit: Payer: Self-pay

## 2021-07-24 DIAGNOSIS — M6281 Muscle weakness (generalized): Secondary | ICD-10-CM

## 2021-07-24 DIAGNOSIS — R262 Difficulty in walking, not elsewhere classified: Secondary | ICD-10-CM

## 2021-07-24 DIAGNOSIS — M25661 Stiffness of right knee, not elsewhere classified: Secondary | ICD-10-CM | POA: Diagnosis not present

## 2021-07-24 DIAGNOSIS — G8929 Other chronic pain: Secondary | ICD-10-CM | POA: Diagnosis not present

## 2021-07-24 DIAGNOSIS — M25561 Pain in right knee: Secondary | ICD-10-CM | POA: Diagnosis not present

## 2021-07-24 NOTE — Therapy (Signed)
[facility] [address] [place], [place], [address] Phone: [phone]   Fax:  [phone]  Physical Therapy Treatment  Patient Details  Name: [name] MRN: [id number] Date of Birth: Jan 09, 1946 Referring Provider (PT): Gean Birchwood MD   Encounter Date: 07/24/2021   PT End of Session - 07/24/21 1708     Visit Number 2    Number of Visits 18    Date for PT Re-Evaluation 09/02/21    Authorization Type UHC medicare, visit based on medical necessity follow MCR guidelines    Progress Note Due on Visit 10    PT Start Time 1618    PT Stop Time 1708    PT Time Calculation (min) 50 min    Activity Tolerance Patient tolerated treatment well    Behavior During Therapy [facility] for tasks assessed/performed             Past Medical History:  Diagnosis Date   Anemia    Anxiety    Arthritis    Essential hypertension    Hyperlipidemia    Pre-diabetes     Past Surgical History:  Procedure Laterality Date   ABDOMINAL HYSTERECTOMY     BREAST CYST EXCISION Right    x2   COLONOSCOPY     COLONOSCOPY N/A 11/28/2015   Procedure: COLONOSCOPY;  Surgeon: Corbin Ade, MD;  [facility];  Service: Endoscopy;  Laterality: N/A;  1:00 PM   Right  breast lumpectomy  1975   benign   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   Subjective Assessment - 07/24/21 1626     Subjective Pt states she's not done as many exercises as she shouldve been.  Reports currently pain is 5/10 Rt knee.    Currently in Pain? Yes    Pain Score 5     Pain Location Knee    Pain Orientation Right    Pain Descriptors / Indicators Aching;Sore    Pain Type Surgical pain                               [facility] Adult PT Treatment/Exercise - 07/24/21 0001        Knee/Hip Exercises: Stretches   Knee: Self-Stretch to increase Flexion Right;20 seconds;5 reps    Knee: Self-Stretch Limitations onto 12" step    Gastroc Stretch Both;3 reps;30 seconds    Gastroc Stretch Limitations slant board      Knee/Hip Exercises: Aerobic   Stationary Bike seat 8 5 minutes rocking      Knee/Hip Exercises: Supine   Quad Sets 10 reps    Short Arc Quad Sets Right;10 reps    Heel Slides 10 reps    Knee Extension AROM;Right    Knee Extension Limitations 5    Knee Flexion AROM;Right    Knee Flexion Limitations 95      Manual Therapy   Manual Therapy Edema management    Manual therapy comments completed seperatetly from all other skilled interventions    Edema Management to Rt knee and distal LE to reduce edema                      PT Short Term Goals - 07/24/21 1711  PT SHORT TERM GOAL #1   Title Patient will demonstrate at leat 2-120 degrees of right knee ROM    Time 3    Period Weeks    Status On-going    Target Date 08/12/21      PT SHORT TERM GOAL #2   Title Patient will report at least 50% improvement in overall symptoms and/or function to demonstrate improved functional mobility    Time 3    Period Weeks    Status On-going    Target Date 08/12/21      PT SHORT TERM GOAL #3   Title Patient will be independent in self management strategies to improve quality of life and functional outcomes.    Time 3    Period Weeks    Status On-going    Target Date 08/12/21               PT Long Term Goals - 07/24/21 1712       PT LONG TERM GOAL #1   Title Patient will report at least 75% improvement in overall symptoms and/or function to demonstrate improved functional mobility    Time 6    Period Weeks    Status On-going      PT LONG TERM GOAL #2   Title Patient will be able to walk at least 226 feet without AD in 2 minutes    Time 6    Period Weeks    Status On-going      PT LONG TERM GOAL #3   Title Patient will  improve on FOTO score to meet predicted outcomes to demonstrate improved functional mobility.    Time 6    Period Weeks    Status On-going                   Plan - 07/24/21 1712     Clinical Impression Statement Reviewed goals and POC moving forward.  Began session on bike for ROM .  Pt still with significant edema and restrictions due to bandage over knee.  Returns to MD next week for post op check up and removal of bandage.  Pt normally wears a TED hose but states she did not wear it today.  Urged her to wear this at night due to being sedentary. Educated on TED hose vs Compression hose.  Progressed quad strengthening with addition of SAQ and added retro massage to assist with reducing edema and discomfort.  AROM following 5-95 (improved from last session of 8-76)  Pt reported much improvement following this and reduction of pain to 2/10.    Personal Factors and Comorbidities Comorbidity 1    Comorbidities previous left TKA    Examination-Activity Limitations Bathing;Sleep;Squat;Stairs;Stand;Transfers;Hygiene/Grooming;Lift;Locomotion Level;Dressing;Carry    Examination-Participation Restrictions Laundry;Meal Prep;Medication Management;Driving;Community Activity;Shop;Cleaning;Church    Stability/Clinical Decision Making Stable/Uncomplicated    Rehab Potential Good    PT Frequency 3x / week    PT Duration 6 weeks    PT Treatment/Interventions ADLs/Self Care Home Management;Aquatic Therapy;Cryotherapy;Electrical Stimulation;Moist Heat;Traction;Balance training;Therapeutic exercise;Manual techniques;Therapeutic activities;Gait training;Patient/family education;Neuromuscular re-education;Dry needling;Passive range of motion    PT Next Visit Plan quad strengthening, ROM.  COntinue retro massage to reduce edema.    PT Home Exercise Plan heel slides, walking, quad isometric    Consulted and Agree with Plan of Care Patient             Patient will benefit from skilled therapeutic  intervention in order to improve the following deficits and impairments:  Pain, Decreased strength, Decreased activity  tolerance, Decreased balance, Difficulty walking, Decreased mobility, Decreased range of motion, Increased edema, Decreased skin integrity, Decreased endurance  Visit Diagnosis: Chronic pain of right knee  Decreased range of motion (ROM) of right knee  Difficulty in walking, not elsewhere classified  Muscle weakness (generalized)     Problem List Patient Active Problem List   Diagnosis Date Noted   Tibial plateau fracture, left, closed, initial encounter 09/21/2020   Special screening for malignant neoplasms, colon    Lurena Nida, PTA/CLT [phone]  Lurena Nida 07/24/2021, 5:13 PM  [facility] [address] [place], [place], [address] Phone: [phone]   Fax:  [phone]  Name: [name] MRN: [id number] Date of Birth: January 17, 1946

## 2021-07-26 ENCOUNTER — Ambulatory Visit (HOSPITAL_COMMUNITY): Payer: Medicare Other | Admitting: Physical Therapy

## 2021-07-26 ENCOUNTER — Other Ambulatory Visit: Payer: Self-pay

## 2021-07-26 ENCOUNTER — Encounter (HOSPITAL_COMMUNITY): Payer: Self-pay | Admitting: Physical Therapy

## 2021-07-26 DIAGNOSIS — M25561 Pain in right knee: Secondary | ICD-10-CM

## 2021-07-26 DIAGNOSIS — G8929 Other chronic pain: Secondary | ICD-10-CM

## 2021-07-26 DIAGNOSIS — M25661 Stiffness of right knee, not elsewhere classified: Secondary | ICD-10-CM

## 2021-07-26 DIAGNOSIS — R262 Difficulty in walking, not elsewhere classified: Secondary | ICD-10-CM

## 2021-07-26 DIAGNOSIS — M6281 Muscle weakness (generalized): Secondary | ICD-10-CM | POA: Diagnosis not present

## 2021-07-26 NOTE — Therapy (Signed)
[facility] [address] [place], [place], [address] Phone: [phone]   Fax:  [phone]  Physical Therapy Treatment  Patient Details  Name: RAVIN BENDALL MRN: [id number] Date of Birth: 03-16-1946 Referring Provider (PT): Gean Birchwood MD   Encounter Date: 07/26/2021   PT End of Session - 07/26/21 1126     Visit Number 3    Number of Visits 18    Date for PT Re-Evaluation 09/02/21    Authorization Type UHC medicare, visit based on medical necessity follow MCR guidelines    Progress Note Due on Visit 10    PT Start Time 1126    PT Stop Time 1205    PT Time Calculation (min) 39 min    Activity Tolerance Patient tolerated treatment well    Behavior During Therapy [facility] for tasks assessed/performed             Past Medical History:  Diagnosis Date   Anemia    Anxiety    Arthritis    Essential hypertension    Hyperlipidemia    Pre-diabetes     Past Surgical History:  Procedure Laterality Date   ABDOMINAL HYSTERECTOMY     BREAST CYST EXCISION Right    x2   COLONOSCOPY     COLONOSCOPY N/A 11/28/2015   Procedure: COLONOSCOPY;  Surgeon: Corbin Ade, MD;  [facility];  Service: Endoscopy;  Laterality: N/A;  1:00 PM   Right  breast lumpectomy  1975   benign   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   Subjective Assessment - 07/26/21 1131     Subjective States that the swelling has been a lot. Reports she put her ted hoes back on. States she has 8/10 pain in her right leg currently.    Currently in Pain? Yes    Pain Score 8     Pain Location Knee    Pain Orientation Right    Pain Type Surgical pain                [facility] PT Assessment - 07/26/21 0001       Assessment   Medical Diagnosis R TKA    Referring  Provider (PT) Gean Birchwood MD    Onset Date/Surgical Date 07/19/21    Next MD Visit 07/30/21                           [facility] Adult PT Treatment/Exercise - 07/26/21 0001       Ambulation/Gait   Ambulation/Gait Yes    Ambulation/Gait Assistance 5: Supervision    Ambulation Distance (Feet) 226 Feet    Assistive device Rolling walker    Gait Pattern Step-to pattern;Decreased hip/knee flexion - right;Decreased weight shift to right;Decreased dorsiflexion - right;Right flexed knee in stance;Antalgic    Ambulation Surface Level;Indoor    Gait velocity reduced    Gait Comments cues to heel toe walk      Knee/Hip Exercises: Aerobic   Stationary Bike seat 10 rocking back and forth 5 minutes      Knee/Hip Exercises: Seated   Long Arc Quad Strengthening;AAROM;Right;20 reps      Knee/Hip Exercises: Supine   Short Arc Quad Sets Right;3 sets;5 reps    Short Arc [employer] Limitations  not full ROM    Heel Slides 15 reps;Right    Knee Extension AROM;Right    Knee Flexion AROM;Right    Other Supine Knee/Hip Exercises knee bends on green ball 4 minutes - 10 second holds      Manual Therapy   Manual Therapy Edema management    Manual therapy comments completed seperatetly from all other skilled interventions    Edema Management to Rt knee and distal LE to reduce edema                    PT Education - 07/26/21 1150     Education Details measured patient for compression stockings, educated on compression, on where to get them. on elevation.    Person(s) Educated Patient    Methods Explanation    Comprehension Verbalized understanding              PT Short Term Goals - 07/24/21 1711       PT SHORT TERM GOAL #1   Title Patient will demonstrate at leat 2-120 degrees of right knee ROM    Time 3    Period Weeks    Status On-going    Target Date 08/12/21      PT SHORT TERM GOAL #2   Title Patient will report at least 50% improvement in overall symptoms  and/or function to demonstrate improved functional mobility    Time 3    Period Weeks    Status On-going    Target Date 08/12/21      PT SHORT TERM GOAL #3   Title Patient will be independent in self management strategies to improve quality of life and functional outcomes.    Time 3    Period Weeks    Status On-going    Target Date 08/12/21               PT Long Term Goals - 07/24/21 1712       PT LONG TERM GOAL #1   Title Patient will report at least 75% improvement in overall symptoms and/or function to demonstrate improved functional mobility    Time 6    Period Weeks    Status On-going      PT LONG TERM GOAL #2   Title Patient will be able to walk at least 226 feet without AD in 2 minutes    Time 6    Period Weeks    Status On-going      PT LONG TERM GOAL #3   Title Patient will improve on FOTO score to meet predicted outcomes to demonstrate improved functional mobility.    Time 6    Period Weeks    Status On-going                   Plan - 07/26/21 1126     Clinical Impression Statement Patient continues to have swelling throughout right lower leg. Educated patient on use of compression garments and measured patient for these. Continued weakness in quad noted. Overall pain and swelling limiting patient. Reduced pain noted end of session. Will continue with current POC and follow up with getting compression garments next session.    Personal Factors and Comorbidities Comorbidity 1    Comorbidities previous left TKA    Examination-Activity Limitations Bathing;Sleep;Squat;Stairs;Stand;Transfers;Hygiene/Grooming;Lift;Locomotion Level;Dressing;Carry    Examination-Participation Restrictions Laundry;Meal Prep;Medication Management;Driving;Community Activity;Shop;Cleaning;Church    Stability/Clinical Decision Making Stable/Uncomplicated    Rehab Potential Good    PT Frequency 3x / week  PT Duration 6 weeks    PT Treatment/Interventions ADLs/Self Care Home  Management;Aquatic Therapy;Cryotherapy;Electrical Stimulation;Moist Heat;Traction;Balance training;Therapeutic exercise;Manual techniques;Therapeutic activities;Gait training;Patient/family education;Neuromuscular re-education;Dry needling;Passive range of motion    PT Next Visit Plan quad strengthening, ROM.  COntinue retro massage to reduce edema.    PT Home Exercise Plan heel slides, walking, quad isometric    Consulted and Agree with Plan of Care Patient             Patient will benefit from skilled therapeutic intervention in order to improve the following deficits and impairments:  Pain, Decreased strength, Decreased activity tolerance, Decreased balance, Difficulty walking, Decreased mobility, Decreased range of motion, Increased edema, Decreased skin integrity, Decreased endurance  Visit Diagnosis: Chronic pain of right knee  Decreased range of motion (ROM) of right knee  Difficulty in walking, not elsewhere classified  Muscle weakness (generalized)     Problem List Patient Active Problem List   Diagnosis Date Noted   Tibial plateau fracture, left, closed, initial encounter 09/21/2020   Special screening for malignant neoplasms, colon    12:10 PM, 07/26/21 Tereasa Coop, DPT Physical Therapy with [facility]  [phone] office   [facility] [address] [place], [place], [address] Phone: [phone]   Fax:  [phone]  Name: BIRGITTA UHLIR MRN: [id number] Date of Birth: [birth year]

## 2021-07-29 ENCOUNTER — Ambulatory Visit (HOSPITAL_COMMUNITY): Payer: Medicare Other | Admitting: Physical Therapy

## 2021-07-29 ENCOUNTER — Encounter (HOSPITAL_COMMUNITY): Payer: Self-pay | Admitting: Physical Therapy

## 2021-07-29 ENCOUNTER — Other Ambulatory Visit: Payer: Self-pay

## 2021-07-29 DIAGNOSIS — M6281 Muscle weakness (generalized): Secondary | ICD-10-CM

## 2021-07-29 DIAGNOSIS — M25661 Stiffness of right knee, not elsewhere classified: Secondary | ICD-10-CM | POA: Diagnosis not present

## 2021-07-29 DIAGNOSIS — R262 Difficulty in walking, not elsewhere classified: Secondary | ICD-10-CM | POA: Diagnosis not present

## 2021-07-29 DIAGNOSIS — G8929 Other chronic pain: Secondary | ICD-10-CM | POA: Diagnosis not present

## 2021-07-29 DIAGNOSIS — M25561 Pain in right knee: Secondary | ICD-10-CM | POA: Diagnosis not present

## 2021-07-29 NOTE — Therapy (Signed)
[facility] [address] [place], [place], [address] Phone: [phone]   Fax:  [phone]  Physical Therapy Treatment  Patient Details  Name: [name] MRN: [id number] Date of Birth: 1946/05/29 Referring Provider (PT): Gean Birchwood MD   Encounter Date: 07/29/2021   PT End of Session - 07/29/21 1558     Visit Number 4    Number of Visits 18    Date for PT Re-Evaluation 09/02/21    Authorization Type UHC medicare, visit based on medical necessity follow MCR guidelines    Progress Note Due on Visit 10    PT Start Time 1600    PT Stop Time 1644    PT Time Calculation (min) 44 min    Activity Tolerance Patient tolerated treatment well    Behavior During Therapy [facility] for tasks assessed/performed             Past Medical History:  Diagnosis Date   Anemia    Anxiety    Arthritis    Essential hypertension    Hyperlipidemia    Pre-diabetes     Past Surgical History:  Procedure Laterality Date   ABDOMINAL HYSTERECTOMY     BREAST CYST EXCISION Right    x2   COLONOSCOPY     COLONOSCOPY N/A 11/28/2015   Procedure: COLONOSCOPY;  Surgeon: Corbin Ade, MD;  [facility];  Service: Endoscopy;  Laterality: N/A;  1:00 PM   Right  breast lumpectomy  1975   benign   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   Subjective Assessment - 07/29/21 1603     Subjective States that this weather is bothering her knee. States she ordered her compression garment and it should be here any day. States she sees the MD tomorrow and will get her bandage removed. States current pain is 5/10 at rest and 8/10 with activity.    Currently in Pain? Yes    Pain Score 5     Pain Location Knee    Pain Orientation Right    Pain Descriptors / Indicators  Aching;Sore                [facility] PT Assessment - 07/29/21 0001       Assessment   Medical Diagnosis R TKA    Referring Provider (PT) Gean Birchwood MD    Onset Date/Surgical Date 07/19/21    Next MD Visit 07/30/21                           [facility] Adult PT Treatment/Exercise - 07/29/21 0001       Ambulation/Gait   Ambulation/Gait Yes    Ambulation Distance (Feet) 452 Feet    Gait Comments cues to heel toe walk and bend the knee, cues to stand up tall and keep hips forward      Knee/Hip Exercises: Seated   Long Arc Quad Strengthening;Right;2 sets;5 reps   AROM to available ROM then PROM ASSIST from PT to end range - then 5 second hold at end range then eccentric lower     Knee/Hip Exercises: Supine   Heel Slides Right    Heel Slides Limitations 10 second holds with belt 3 minuetes transitioning back and forth    Knee Extension AROM;Right  Knee Extension Limitations 2   lacking   Knee Flexion AROM;Right    Knee Flexion Limitations 101    Other Supine Knee/Hip Exercises tibial bounccing on ball 4 minutes; hamstring isometrics into ball x20 5" holds B knees bent    Other Supine Knee/Hip Exercises knee bends on green ball 4 minutes - 10 second holds      Manual Therapy   Manual Therapy Edema management    Manual therapy comments completed seperatetly from all other skilled interventions    Edema Management to Rt knee and distal LE to reduce edema                      PT Short Term Goals - 07/24/21 1711       PT SHORT TERM GOAL #1   Title Patient will demonstrate at leat 2-120 degrees of right knee ROM    Time 3    Period Weeks    Status On-going    Target Date 08/12/21      PT SHORT TERM GOAL #2   Title Patient will report at least 50% improvement in overall symptoms and/or function to demonstrate improved functional mobility    Time 3    Period Weeks    Status On-going    Target Date 08/12/21      PT SHORT TERM GOAL #3   Title  Patient will be independent in self management strategies to improve quality of life and functional outcomes.    Time 3    Period Weeks    Status On-going    Target Date 08/12/21               PT Long Term Goals - 07/24/21 1712       PT LONG TERM GOAL #1   Title Patient will report at least 75% improvement in overall symptoms and/or function to demonstrate improved functional mobility    Time 6    Period Weeks    Status On-going      PT LONG TERM GOAL #2   Title Patient will be able to walk at least 226 feet without AD in 2 minutes    Time 6    Period Weeks    Status On-going      PT LONG TERM GOAL #3   Title Patient will improve on FOTO score to meet predicted outcomes to demonstrate improved functional mobility.    Time 6    Period Weeks    Status On-going                   Plan - 07/29/21 1646     Clinical Impression Statement Improved quad strength noted today with increased AROM into TKE against gravity. Continued swelling throughout right leg noted. Able to bring leg up onto table without assist from hands or contralateral leg. Improved ROM noted on this date. Patient to follow up with MD tomorrow will follow up with patient about visit next session.    Personal Factors and Comorbidities Comorbidity 1    Comorbidities previous left TKA    Examination-Activity Limitations Bathing;Sleep;Squat;Stairs;Stand;Transfers;Hygiene/Grooming;Lift;Locomotion Level;Dressing;Carry    Examination-Participation Restrictions Laundry;Meal Prep;Medication Management;Driving;Community Activity;Shop;Cleaning;Church    Stability/Clinical Decision Making Stable/Uncomplicated    Rehab Potential Good    PT Frequency 3x / week    PT Duration 6 weeks    PT Treatment/Interventions ADLs/Self Care Home Management;Aquatic Therapy;Cryotherapy;Electrical Stimulation;Moist Heat;Traction;Balance training;Therapeutic exercise;Manual techniques;Therapeutic activities;Gait  training;Patient/family education;Neuromuscular re-education;Dry needling;Passive range of motion    PT  Next Visit Plan quad strengthening, ROM.  COntinue retro massage to reduce edema.    PT Home Exercise Plan heel slides, walking, quad isometric, LAQ with as needed assist    Consulted and Agree with Plan of Care Patient             Patient will benefit from skilled therapeutic intervention in order to improve the following deficits and impairments:  Pain, Decreased strength, Decreased activity tolerance, Decreased balance, Difficulty walking, Decreased mobility, Decreased range of motion, Increased edema, Decreased skin integrity, Decreased endurance  Visit Diagnosis: Chronic pain of right knee  Decreased range of motion (ROM) of right knee  Difficulty in walking, not elsewhere classified  Muscle weakness (generalized)     Problem List Patient Active Problem List   Diagnosis Date Noted   Tibial plateau fracture, left, closed, initial encounter 09/21/2020   Special screening for malignant neoplasms, colon    4:48 PM, 07/29/21 Tereasa Coop, DPT Physical Therapy with [facility]  [phone] office   [facility] [address] [place], [place], [address] Phone: [phone]   Fax:  [phone]  Name: FATUMA DOWERS MRN: [id number] Date of Birth: [birth year]

## 2021-07-30 DIAGNOSIS — Z96651 Presence of right artificial knee joint: Secondary | ICD-10-CM | POA: Diagnosis not present

## 2021-07-30 DIAGNOSIS — Z9889 Other specified postprocedural states: Secondary | ICD-10-CM | POA: Diagnosis not present

## 2021-07-31 ENCOUNTER — Other Ambulatory Visit: Payer: Self-pay

## 2021-07-31 ENCOUNTER — Encounter (HOSPITAL_COMMUNITY): Payer: Self-pay

## 2021-07-31 ENCOUNTER — Ambulatory Visit (HOSPITAL_COMMUNITY): Payer: Medicare Other

## 2021-07-31 DIAGNOSIS — R262 Difficulty in walking, not elsewhere classified: Secondary | ICD-10-CM

## 2021-07-31 DIAGNOSIS — M6281 Muscle weakness (generalized): Secondary | ICD-10-CM | POA: Diagnosis not present

## 2021-07-31 DIAGNOSIS — G8929 Other chronic pain: Secondary | ICD-10-CM

## 2021-07-31 DIAGNOSIS — M25561 Pain in right knee: Secondary | ICD-10-CM | POA: Diagnosis not present

## 2021-07-31 DIAGNOSIS — M25661 Stiffness of right knee, not elsewhere classified: Secondary | ICD-10-CM

## 2021-07-31 NOTE — Therapy (Signed)
[facility] [address] [place], [place], [address] Phone: [phone]   Fax:  [phone]  Physical Therapy Treatment  Patient Details  Name: [name] MRN: [id number] Date of Birth: 09/10/1946 Referring Provider (PT): Gean Birchwood MD   Encounter Date: 07/31/2021   PT End of Session - 07/31/21 1621     Visit Number 5    Number of Visits 18    Date for PT Re-Evaluation 09/02/21    Authorization Type UHC medicare, visit based on medical necessity follow MCR guidelines    Progress Note Due on Visit 10    PT Start Time 1615    PT Stop Time 1707   5' on bike at EOS.   PT Time Calculation (min) 52 min    Activity Tolerance Patient tolerated treatment well    Behavior During Therapy [facility] for tasks assessed/performed             Past Medical History:  Diagnosis Date   Anemia    Anxiety    Arthritis    Essential hypertension    Hyperlipidemia    Pre-diabetes     Past Surgical History:  Procedure Laterality Date   ABDOMINAL HYSTERECTOMY     BREAST CYST EXCISION Right    x2   COLONOSCOPY     COLONOSCOPY N/A 11/28/2015   Procedure: COLONOSCOPY;  Surgeon: Corbin Ade, MD;  [facility];  Service: Endoscopy;  Laterality: N/A;  1:00 PM   Right  breast lumpectomy  1975   benign   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   Subjective Assessment - 07/31/21 1620     Subjective Pt reports she is sore from the session Monday.  Saw MD who was happy with progress, encouraged pt to wear TED hose during the days.  Reports she got cane out of closet and started walking with it yesterday.    Pertinent History previous left knee    Patient Stated Goals to be able to stay independent    Currently in Pain? Yes    Pain Score 4     Pain  Location Knee    Pain Orientation Right    Pain Descriptors / Indicators Sore    Pain Type Surgical pain    Pain Onset 1 to 4 weeks ago    Pain Frequency Intermittent    Aggravating Factors  bending, standing, walking    Pain Relieving Factors rest, ice, elevation, medication.                               [facility] Adult PT Treatment/Exercise - 07/31/21 0001       Ambulation/Gait   Ambulation/Gait Yes    Ambulation/Gait Assistance 5: Supervision    Ambulation Distance (Feet) 226 Feet   140 ft with SPC   Assistive device Rolling walker;Straight cane    Gait Pattern Step-through pattern;Decreased step length - left;Decreased stance time - right    Ambulation Surface Level;Indoor    Gait Comments cueing for heel to toe, increased Lt step length, Rt stance phase; SPC: proper handuse and 3 point sequence      Exercises   Exercises Knee/Hip      Knee/Hip Exercises: Stretches   Knee: Self-Stretch to  increase Flexion Right;5 reps;10 seconds      Knee/Hip Exercises: Aerobic   Stationary Bike seat 10 rocking back and forth 5 minutes      Knee/Hip Exercises: Seated   Long Arc Quad Strengthening;Right;10 reps    Long Arc Quad Limitations 10" holds      Knee/Hip Exercises: Supine   Quad Sets 10 reps    Short Arc Quad Sets 10 reps    Heel Slides Right;AAROM    Heel Slides Limitations 10 second holds with AAROM at end range    Straight Leg Raises 10 reps    Straight Leg Raises Limitations extension lag, cueing for quad set prior raise    Knee Extension AROM;Right    Knee Extension Limitations 2    Knee Flexion AROM;Right    Knee Flexion Limitations 102      Manual Therapy   Manual Therapy Edema management    Manual therapy comments completed seperatetly from all other skilled interventions    Edema Management Retrograde massage with LE elevated for edema control                      PT Short Term Goals - 07/24/21 1711       PT SHORT TERM GOAL #1    Title Patient will demonstrate at leat 2-120 degrees of right knee ROM    Time 3    Period Weeks    Status On-going    Target Date 08/12/21      PT SHORT TERM GOAL #2   Title Patient will report at least 50% improvement in overall symptoms and/or function to demonstrate improved functional mobility    Time 3    Period Weeks    Status On-going    Target Date 08/12/21      PT SHORT TERM GOAL #3   Title Patient will be independent in self management strategies to improve quality of life and functional outcomes.    Time 3    Period Weeks    Status On-going    Target Date 08/12/21               PT Long Term Goals - 07/24/21 1712       PT LONG TERM GOAL #1   Title Patient will report at least 75% improvement in overall symptoms and/or function to demonstrate improved functional mobility    Time 6    Period Weeks    Status On-going      PT LONG TERM GOAL #2   Title Patient will be able to walk at least 226 feet without AD in 2 minutes    Time 6    Period Weeks    Status On-going      PT LONG TERM GOAL #3   Title Patient will improve on FOTO score to meet predicted outcomes to demonstrate improved functional mobility.    Time 6    Period Weeks    Status On-going                   Plan - 07/31/21 1643     Clinical Impression Statement Began session with retrograde massage for edema control prior ROM based exercises.  Added SLR for quad strengthening wiht noted extension lag, cueing for quad set prior raise.  Pt stated she has began walking with SPC at home, reviewed proper hand and sequence for safety wiht LRAD.  Encouraged pt to continues with RW outside of home.  Educated benefits with  compression garments for edema control.    Personal Factors and Comorbidities Comorbidity 1    Comorbidities previous left TKA    Examination-Activity Limitations Bathing;Sleep;Squat;Stairs;Stand;Transfers;Hygiene/Grooming;Lift;Locomotion Level;Dressing;Carry     Examination-Participation Restrictions Laundry;Meal Prep;Medication Management;Driving;Community Activity;Shop;Cleaning;Church    Stability/Clinical Decision Making Stable/Uncomplicated    Clinical Decision Making Low    Rehab Potential Good    PT Frequency 3x / week    PT Duration 6 weeks    PT Treatment/Interventions ADLs/Self Care Home Management;Aquatic Therapy;Cryotherapy;Electrical Stimulation;Moist Heat;Traction;Balance training;Therapeutic exercise;Manual techniques;Therapeutic activities;Gait training;Patient/family education;Neuromuscular re-education;Dry needling;Passive range of motion    PT Next Visit Plan quad strengthening, ROM.  COntinue retro massage to reduce edema.    PT Home Exercise Plan heel slides, walking, quad isometric, LAQ with as needed assist    Consulted and Agree with Plan of Care Patient             Patient will benefit from skilled therapeutic intervention in order to improve the following deficits and impairments:  Pain, Decreased strength, Decreased activity tolerance, Decreased balance, Difficulty walking, Decreased mobility, Decreased range of motion, Increased edema, Decreased skin integrity, Decreased endurance  Visit Diagnosis: Chronic pain of right knee  Decreased range of motion (ROM) of right knee  Difficulty in walking, not elsewhere classified  Muscle weakness (generalized)     Problem List Patient Active Problem List   Diagnosis Date Noted   Tibial plateau fracture, left, closed, initial encounter 09/21/2020   Special screening for malignant neoplasms, colon    Becky Sax, LPTA/CLT; CBIS [phone]  Juel Burrow 07/31/2021, 6:36 PM  [facility] [address] [place], [place], [address] Phone: [phone]   Fax:  [phone]  Name: [name] MRN: [id number] Date of Birth: 12-Jan-1946

## 2021-08-02 ENCOUNTER — Encounter (HOSPITAL_COMMUNITY): Payer: Self-pay

## 2021-08-02 ENCOUNTER — Ambulatory Visit (HOSPITAL_COMMUNITY): Payer: Medicare Other

## 2021-08-02 ENCOUNTER — Other Ambulatory Visit: Payer: Self-pay

## 2021-08-02 DIAGNOSIS — G8929 Other chronic pain: Secondary | ICD-10-CM

## 2021-08-02 DIAGNOSIS — M6281 Muscle weakness (generalized): Secondary | ICD-10-CM

## 2021-08-02 DIAGNOSIS — R262 Difficulty in walking, not elsewhere classified: Secondary | ICD-10-CM | POA: Diagnosis not present

## 2021-08-02 DIAGNOSIS — M25661 Stiffness of right knee, not elsewhere classified: Secondary | ICD-10-CM

## 2021-08-02 DIAGNOSIS — M25561 Pain in right knee: Secondary | ICD-10-CM | POA: Diagnosis not present

## 2021-08-02 NOTE — Therapy (Signed)
[facility] [address] [place], [place], [address] Phone: [phone]   Fax:  [phone]  Physical Therapy Treatment  Patient Details  Name: [name] MRN: [id number] Date of Birth: 04/16/1946 Referring Provider (PT): Gean Birchwood MD   Encounter Date: 08/02/2021   PT End of Session - 08/02/21 0840     Visit Number 6    Number of Visits 18    Date for PT Re-Evaluation 09/02/21    Authorization Type UHC medicare, visit based on medical necessity follow MCR guidelines    Progress Note Due on Visit 10    PT Start Time 0825    PT Stop Time 0920    PT Time Calculation (min) 55 min    Activity Tolerance Patient tolerated treatment well    Behavior During Therapy [facility] for tasks assessed/performed             Past Medical History:  Diagnosis Date   Anemia    Anxiety    Arthritis    Essential hypertension    Hyperlipidemia    Pre-diabetes     Past Surgical History:  Procedure Laterality Date   ABDOMINAL HYSTERECTOMY     BREAST CYST EXCISION Right    x2   COLONOSCOPY     COLONOSCOPY N/A 11/28/2015   Procedure: COLONOSCOPY;  Surgeon: Corbin Ade, MD;  [facility];  Service: Endoscopy;  Laterality: N/A;  1:00 PM   Right  breast lumpectomy  1975   benign   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   Subjective Assessment - 08/02/21 0838     Subjective Pt reports she is feeling good, reports improvements with functional tasks at home including walking with cane and ability to do laundry and washed dishes without having to take rest break.  Feels her knee is moving better today.  Arrived wearing TED hose and feels they are helping with swelling on knee, continues to have some swelling on ankle today.    Pertinent History  previous left knee    Patient Stated Goals to be able to stay independent    Currently in Pain? No/denies                [facility] PT Assessment - 08/02/21 0001       Assessment   Medical Diagnosis R TKA    Referring Provider (PT) Gean Birchwood MD    Onset Date/Surgical Date 07/19/21    Next MD Visit 08/27/21    Prior Therapy yes for left knee replacement                           [facility] Adult PT Treatment/Exercise - 08/02/21 0001       Exercises   Exercises Knee/Hip      Knee/Hip Exercises: Stretches   Quad Stretch 3 reps;30 seconds    Quad Stretch Limitations prone wiht rope      Knee/Hip Exercises: Seated   Long Arc Quad Strengthening;Right;10 reps    Sit to [employer] 5 reps;without UE support   eccentric control, cueing for equal weight bearing     Knee/Hip Exercises: Supine   Quad Sets 10 reps    Heel Slides Right;AAROM    Heel Slides Limitations 10 second holds with  AAROM at end range    Terminal Knee Extension Left;10 reps    Terminal Knee Extension Limitations 5" holds; tactile.verbal cueing for mechanics    Straight Leg Raises 10 reps    Straight Leg Raises Limitations extension lag, cueing for quad set prior raise    Knee Extension AROM;Right    Knee Extension Limitations 2    Knee Flexion AROM;Right    Knee Flexion Limitations 106      Knee/Hip Exercises: Prone   Hamstring Curl 10 reps      Manual Therapy   Manual Therapy Edema management    Manual therapy comments completed seperatetly from all other skilled interventions    Edema Management Retrograde massage with LE elevated for edema control                      PT Short Term Goals - 07/24/21 1711       PT SHORT TERM GOAL #1   Title Patient will demonstrate at leat 2-120 degrees of right knee ROM    Time 3    Period Weeks    Status On-going    Target Date 08/12/21      PT SHORT TERM GOAL #2   Title Patient will report at least 50% improvement in overall symptoms  and/or function to demonstrate improved functional mobility    Time 3    Period Weeks    Status On-going    Target Date 08/12/21      PT SHORT TERM GOAL #3   Title Patient will be independent in self management strategies to improve quality of life and functional outcomes.    Time 3    Period Weeks    Status On-going    Target Date 08/12/21               PT Long Term Goals - 07/24/21 1712       PT LONG TERM GOAL #1   Title Patient will report at least 75% improvement in overall symptoms and/or function to demonstrate improved functional mobility    Time 6    Period Weeks    Status On-going      PT LONG TERM GOAL #2   Title Patient will be able to walk at least 226 feet without AD in 2 minutes    Time 6    Period Weeks    Status On-going      PT LONG TERM GOAL #3   Title Patient will improve on FOTO score to meet predicted outcomes to demonstrate improved functional mobility.    Time 6    Period Weeks    Status On-going                   Plan - 08/02/21 0850     Clinical Impression Statement Edema reducing Rt LE though continues proximal knee and ankle, began session with retrograde massage for edema control prior ROM based exercises.  Pt progressing well toward POC with improve isometric quad contraction and improved AROM 2-106 degrees.  Added quad stretch for flexion.    Personal Factors and Comorbidities Comorbidity 1    Comorbidities previous left TKA    Examination-Activity Limitations Bathing;Sleep;Squat;Stairs;Stand;Transfers;Hygiene/Grooming;Lift;Locomotion Level;Dressing;Carry    Examination-Participation Restrictions Laundry;Meal Prep;Medication Management;Driving;Community Activity;Shop;Cleaning;Church    Stability/Clinical Decision Making Stable/Uncomplicated    Clinical Decision Making Low    Rehab Potential Good    PT Frequency 3x / week    PT Duration 6 weeks    PT  Treatment/Interventions ADLs/Self Care Home Management;Aquatic  Therapy;Cryotherapy;Electrical Stimulation;Moist Heat;Traction;Balance training;Therapeutic exercise;Manual techniques;Therapeutic activities;Gait training;Patient/family education;Neuromuscular re-education;Dry needling;Passive range of motion    PT Next Visit Plan Add standing TKE.  Progress quad strengthening, ROM.  Continue retro massage to reduce edema.    PT Home Exercise Plan heel slides, walking, quad isometric, LAQ with as needed assist    Consulted and Agree with Plan of Care Patient             Patient will benefit from skilled therapeutic intervention in order to improve the following deficits and impairments:  Pain, Decreased strength, Decreased activity tolerance, Decreased balance, Difficulty walking, Decreased mobility, Decreased range of motion, Increased edema, Decreased skin integrity, Decreased endurance  Visit Diagnosis: Chronic pain of right knee  Decreased range of motion (ROM) of right knee  Difficulty in walking, not elsewhere classified  Muscle weakness (generalized)     Problem List Patient Active Problem List   Diagnosis Date Noted   Tibial plateau fracture, left, closed, initial encounter 09/21/2020   Special screening for malignant neoplasms, colon    Becky Sax, LPTA/CLT; CBIS [phone]  Juel Burrow 08/02/2021, 9:33 AM  [facility] [address] [place], [place], [address] Phone: [phone]   Fax:  [phone]  Name: [name] MRN: [id number] Date of Birth: [birth year]

## 2021-08-05 ENCOUNTER — Ambulatory Visit (HOSPITAL_COMMUNITY): Payer: Medicare Other | Admitting: Physical Therapy

## 2021-08-05 ENCOUNTER — Encounter (HOSPITAL_COMMUNITY): Payer: Self-pay | Admitting: Physical Therapy

## 2021-08-05 ENCOUNTER — Other Ambulatory Visit: Payer: Self-pay

## 2021-08-05 DIAGNOSIS — R262 Difficulty in walking, not elsewhere classified: Secondary | ICD-10-CM

## 2021-08-05 DIAGNOSIS — G8929 Other chronic pain: Secondary | ICD-10-CM | POA: Diagnosis not present

## 2021-08-05 DIAGNOSIS — M25661 Stiffness of right knee, not elsewhere classified: Secondary | ICD-10-CM | POA: Diagnosis not present

## 2021-08-05 DIAGNOSIS — M6281 Muscle weakness (generalized): Secondary | ICD-10-CM

## 2021-08-05 DIAGNOSIS — M25561 Pain in right knee: Secondary | ICD-10-CM | POA: Diagnosis not present

## 2021-08-05 NOTE — Therapy (Signed)
[facility] [address] [place], [place], [address] Phone: [phone]   Fax:  [phone]  Pediatric Physical Therapy Treatment  Patient Details  Name: LAYSA KIMMEY MRN: [id number] Date of Birth: [birth year] No data recorded  Encounter date: 08/05/2021    PT End of Session - 08/05/21 1740     Visit Number 7    Number of Visits 18    Date for PT Re-Evaluation 09/02/21    Authorization Type UHC medicare, visit based on medical necessity follow MCR guidelines    Progress Note Due on Visit 10    PT Start Time 1640    PT Stop Time 1720    PT Time Calculation (min) 40 min    Activity Tolerance Patient tolerated treatment well    Behavior During Therapy [facility] for tasks assessed/performed             Past Medical History:  Diagnosis Date   Anemia    Anxiety    Arthritis    Essential hypertension    Hyperlipidemia    Pre-diabetes     Past Surgical History:  Procedure Laterality Date   ABDOMINAL HYSTERECTOMY     BREAST CYST EXCISION Right    x2   COLONOSCOPY     COLONOSCOPY N/A 11/28/2015   Procedure: COLONOSCOPY;  Surgeon: Corbin Ade, MD;  [facility];  Service: Endoscopy;  Laterality: N/A;  1:00 PM   Right  breast lumpectomy  1975   benign   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    Subjective Assessment - 08/05/21 1647     Subjective Pt reports that she had a huge decrease in swelling in ankle/lower leg saturday morning and its just in her knee and upper leg now    Pertinent History previous left knee    Patient Stated Goals to be able to stay independent    Currently in Pain? Yes    Pain Score 4     Pain Location Knee    Pain Orientation Right                           [facility] Adult PT  Treatment/Exercise - 08/05/21 0001       Exercises   Exercises Knee/Hip      Knee/Hip Exercises: Aerobic   Nustep 4 min      Knee/Hip Exercises: Standing   Knee Flexion AROM;Right;2 sets;10 reps   on 4" box with weight shift back for knee extension   Terminal Knee Extension Strengthening;Right;10 reps;Theraband   green theraband     Knee/Hip Exercises: Seated   Long Arc Quad Strengthening;Right;15 reps    Long Arc Quad Limitations assist for decreased hip flexion comp    Heel Slides Right;AROM;1 set;15 reps   assist with LLE to increase pull.     Knee/Hip Exercises: Supine   Short Arc Quad Sets 10 reps;Both    Short Arc [employer] Limitations first set on L followed by 8 on R, increased diffiuclty with isolated extension                     PT Education - 08/05/21 1739     Education Details scar massage and mobilization, LAQ with increased focus on knee  straight vs lifting it higher    Person(s) Educated Patient    Methods Explanation;Demonstration    Comprehension Verbalized understanding;Returned demonstration                   Plan - 08/05/21 1740     Clinical Impression Statement great within session improvement in knee extension ROM with most improvement post SAQ with facilitation and tactile cues. Demo good improvement with knee flexion throughout and consistent heel toe gait. Discuss scar massage and mobilization with note of decreased likelihood of opening scar, but continue to mobilize and massage to help decrease swelling and improve activation.    Personal Factors and Comorbidities Comorbidity 1    Comorbidities previous left TKA    Examination-Activity Limitations Bathing;Sleep;Squat;Stairs;Stand;Transfers;Hygiene/Grooming;Lift;Locomotion Level;Dressing;Carry    Examination-Participation Restrictions Laundry;Meal Prep;Medication Management;Driving;Community Activity;Shop;Cleaning;Church    Stability/Clinical Decision Making Stable/Uncomplicated     Rehab Potential Good    PT Frequency 3x / week    PT Duration 6 weeks    PT Treatment/Interventions ADLs/Self Care Home Management;Aquatic Therapy;Cryotherapy;Electrical Stimulation;Moist Heat;Traction;Balance training;Therapeutic exercise;Manual techniques;Therapeutic activities;Gait training;Patient/family education;Neuromuscular re-education;Dry needling;Passive range of motion    PT Next Visit Plan Add standing TKE.  Progress quad strengthening, ROM.  Continue retro massage to reduce edema.    PT Home Exercise Plan heel slides, walking, quad isometric, LAQ with as needed assist. 8/22: LAQ without assist, SAQ, scar mobilization    Consulted and Agree with Plan of Care Patient             Patient will benefit from skilled therapeutic intervention in order to improve the following deficits and impairments:     Visit Diagnosis: Chronic pain of right knee  Decreased range of motion (ROM) of right knee  Difficulty in walking, not elsewhere classified  Muscle weakness (generalized)   Problem List Patient Active Problem List   Diagnosis Date Noted   Tibial plateau fracture, left, closed, initial encounter 09/21/2020   Special screening for malignant neoplasms, colon     5:43 PM,08/05/21 Esmeralda Links, PT, DPT Physical Therapist at [facility] [address] [place], [place], [address] Phone: [phone]   Fax:  [phone]  Name: DAWNE CASALI MRN: [id number] Date of Birth: June 22, 1946

## 2021-08-07 ENCOUNTER — Ambulatory Visit (HOSPITAL_COMMUNITY): Payer: Medicare Other | Admitting: Physical Therapy

## 2021-08-07 ENCOUNTER — Other Ambulatory Visit: Payer: Self-pay

## 2021-08-07 DIAGNOSIS — M6281 Muscle weakness (generalized): Secondary | ICD-10-CM | POA: Diagnosis not present

## 2021-08-07 DIAGNOSIS — G8929 Other chronic pain: Secondary | ICD-10-CM

## 2021-08-07 DIAGNOSIS — M25661 Stiffness of right knee, not elsewhere classified: Secondary | ICD-10-CM

## 2021-08-07 DIAGNOSIS — R262 Difficulty in walking, not elsewhere classified: Secondary | ICD-10-CM

## 2021-08-07 DIAGNOSIS — M25561 Pain in right knee: Secondary | ICD-10-CM | POA: Diagnosis not present

## 2021-08-07 NOTE — Therapy (Signed)
[facility] [address] [place], [place], [address] Phone: [phone]   Fax:  [phone]  Physical Therapy Treatment  Patient Details  Name: [name] MRN: [id number] Date of Birth: 1946-10-12 Referring Provider (PT): Gean Birchwood MD   Encounter Date: 08/07/2021   PT End of Session - 08/07/21 1635     Visit Number 8    Number of Visits 18    Date for PT Re-Evaluation 09/02/21    Authorization Type UHC medicare, visit based on medical necessity follow MCR guidelines    Progress Note Due on Visit 10    PT Start Time 1635    PT Stop Time 1720    PT Time Calculation (min) 45 min    Activity Tolerance Patient tolerated treatment well    Behavior During Therapy [facility] for tasks assessed/performed             Past Medical History:  Diagnosis Date   Anemia    Anxiety    Arthritis    Essential hypertension    Hyperlipidemia    Pre-diabetes     Past Surgical History:  Procedure Laterality Date   ABDOMINAL HYSTERECTOMY     BREAST CYST EXCISION Right    x2   COLONOSCOPY     COLONOSCOPY N/A 11/28/2015   Procedure: COLONOSCOPY;  Surgeon: Corbin Ade, MD;  [facility];  Service: Endoscopy;  Laterality: N/A;  1:00 PM   Right  breast lumpectomy  1975   benign   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   Subjective Assessment - 08/07/21 1603     Subjective pt states her leg is doing better everyday.  STates shte is no longer wearing her "compression stockings" (ted hose).    Currently in Pain? No/denies                               [facility] Adult PT Treatment/Exercise - 08/07/21 0001       Knee/Hip Exercises: Aerobic   Stationary Bike seat 10 full revolutions 3 minutes      Knee/Hip Exercises: Standing    Heel Raises 15 reps    Knee Flexion AROM;Right;2 sets;10 reps    Stairs 4" reciprocally with Rt HR 2RT      Knee/Hip Exercises: Supine   Bridges Both;10 reps    Bridges Limitations cues for even rise    Straight Leg Raises 10 reps    Straight Leg Raises Limitations no lag    Knee Extension AROM    Knee Extension Limitations 0    Knee Flexion AROM    Knee Flexion Limitations 110                      PT Short Term Goals - 07/24/21 1711       PT SHORT TERM GOAL #1   Title Patient will demonstrate at leat 2-120 degrees of right knee ROM    Time 3    Period Weeks    Status On-going    Target Date 08/12/21      PT SHORT TERM GOAL #2   Title Patient will report at least 50% improvement in overall symptoms and/or function to demonstrate improved functional mobility  Time 3    Period Weeks    Status On-going    Target Date 08/12/21      PT SHORT TERM GOAL #3   Title Patient will be independent in self management strategies to improve quality of life and functional outcomes.    Time 3    Period Weeks    Status On-going    Target Date 08/12/21               PT Long Term Goals - 07/24/21 1712       PT LONG TERM GOAL #1   Title Patient will report at least 75% improvement in overall symptoms and/or function to demonstrate improved functional mobility    Time 6    Period Weeks    Status On-going      PT LONG TERM GOAL #2   Title Patient will be able to walk at least 226 feet without AD in 2 minutes    Time 6    Period Weeks    Status On-going      PT LONG TERM GOAL #3   Title Patient will improve on FOTO score to meet predicted outcomes to demonstrate improved functional mobility.    Time 6    Period Weeks    Status On-going                   Plan - 08/07/21 1633     Clinical Impression Statement Pt able to make full revolutions on bike today with first revolution.  Pt pleased with progress and how her pain is reduced and function  improving.    Pt is now ambulating community distances with SPC and able to negotiate 4" steps reciprocally with 1 HR.  continued with manual for edema control and scar massage to improve ROM.  Most tightness at proximal end of scar.  ROM today following manual 0-110.    Personal Factors and Comorbidities Comorbidity 1    Comorbidities previous left TKA    Examination-Activity Limitations Bathing;Sleep;Squat;Stairs;Stand;Transfers;Hygiene/Grooming;Lift;Locomotion Level;Dressing;Carry    Examination-Participation Restrictions Laundry;Meal Prep;Medication Management;Driving;Community Activity;Shop;Cleaning;Church    Stability/Clinical Decision Making Stable/Uncomplicated    Rehab Potential Good    PT Frequency 3x / week    PT Duration 6 weeks    PT Treatment/Interventions ADLs/Self Care Home Management;Aquatic Therapy;Cryotherapy;Electrical Stimulation;Moist Heat;Traction;Balance training;Therapeutic exercise;Manual techniques;Therapeutic activities;Gait training;Patient/family education;Neuromuscular re-education;Dry needling;Passive range of motion    PT Next Visit Plan Continue retro massage to reduce edema.  Begin repeated step ups and lunges next session to progress functional strengthening.    PT Home Exercise Plan heel slides, walking, quad isometric, LAQ with as needed assist. 8/22: LAQ without assist, SAQ, scar mobilization    Consulted and Agree with Plan of Care Patient             Patient will benefit from skilled therapeutic intervention in order to improve the following deficits and impairments:  Pain, Decreased strength, Decreased activity tolerance, Decreased balance, Difficulty walking, Decreased mobility, Decreased range of motion, Increased edema, Decreased skin integrity, Decreased endurance  Visit Diagnosis: Chronic pain of right knee  Decreased range of motion (ROM) of right knee  Difficulty in walking, not elsewhere classified  Muscle weakness  (generalized)     Problem List Patient Active Problem List   Diagnosis Date Noted   Tibial plateau fracture, left, closed, initial encounter 09/21/2020   Special screening for malignant neoplasms, colon    Lurena Nida, PTA/CLT [phone]  Emeline Gins B 08/07/2021, 4:37 PM  [facility] [address] [place], [place], [address] Phone: [phone]   Fax:  [phone]  Name: [name] MRN: [id number] Date of Birth: [birth year]

## 2021-08-09 ENCOUNTER — Other Ambulatory Visit: Payer: Self-pay

## 2021-08-09 ENCOUNTER — Ambulatory Visit (HOSPITAL_COMMUNITY): Payer: Medicare Other | Admitting: Physical Therapy

## 2021-08-09 ENCOUNTER — Encounter (HOSPITAL_COMMUNITY): Payer: Self-pay | Admitting: Physical Therapy

## 2021-08-09 DIAGNOSIS — G8929 Other chronic pain: Secondary | ICD-10-CM

## 2021-08-09 DIAGNOSIS — R262 Difficulty in walking, not elsewhere classified: Secondary | ICD-10-CM

## 2021-08-09 DIAGNOSIS — M6281 Muscle weakness (generalized): Secondary | ICD-10-CM

## 2021-08-09 DIAGNOSIS — M25661 Stiffness of right knee, not elsewhere classified: Secondary | ICD-10-CM | POA: Diagnosis not present

## 2021-08-09 DIAGNOSIS — M25561 Pain in right knee: Secondary | ICD-10-CM | POA: Diagnosis not present

## 2021-08-09 NOTE — Therapy (Signed)
[facility] [address] [place], [place], [address] Phone: [phone]   Fax:  [phone]  Physical Therapy Treatment and Progress Note  Patient Details  Name: [name] MRN: [id number] Date of Birth: [birth year] Referring Provider (PT): Frederik Pear MD  Progress Note Reporting Period 07/22/21 to 08/09/21  See note below for Objective Data and Assessment of Progress/Goals.      Encounter Date: 08/09/2021   PT End of Session - 08/09/21 1530     Visit Number 9    Number of Visits 18    Date for PT Re-Evaluation 09/02/21    Authorization Type UHC medicare, visit based on medical necessity follow MCR guidelines    Progress Note Due on Visit 19    PT Start Time 1530    PT Stop Time 1610    PT Time Calculation (min) 40 min    Activity Tolerance Patient tolerated treatment well    Behavior During Therapy WFL for tasks assessed/performed             Past Medical History:  Diagnosis Date   Anemia    Anxiety    Arthritis    Essential hypertension    Hyperlipidemia    Pre-diabetes     Past Surgical History:  Procedure Laterality Date   ABDOMINAL HYSTERECTOMY     BREAST CYST EXCISION Right    x2   COLONOSCOPY     COLONOSCOPY N/A 11/28/2015   Procedure: COLONOSCOPY;  Surgeon: Daneil Dolin, MD;  [facility];  Service: Endoscopy;  Laterality: N/A;  1:00 PM   Right  breast lumpectomy  1975   benign   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   Subjective Assessment - 08/09/21 1533     Subjective States she doesn't have any pain. Reports overall she feels about 95% better since start of therapy. States stars and kneeling and squatting are difficult. States she is no longer using the cane in the house.    Currently in Pain?  No/denies                [facility] PT Assessment - 08/09/21 0001       Assessment   Medical Diagnosis R TKA    Referring Provider (PT) Frederik Pear MD    Onset Date/Surgical Date 07/19/21    Next MD Visit 08/27/21      Observation/Other Assessments   Focus on Therapeutic Outcomes (FOTO)  58% function, predicted 57% function      AROM   Right Knee Extension 1   lacking   Right Knee Flexion 112      Ambulation/Gait   Ambulation/Gait Yes    Ambulation/Gait Assistance 5: Supervision    Ambulation Distance (Feet) 236 Feet    Assistive device None    Ambulation Surface Level;Indoor    Gait Comments 2MW, antalgic gait                           [facility] Adult PT Treatment/Exercise - 08/09/21 0001       Knee/Hip Exercises: Stretches   Active Hamstring Stretch Right;3 reps;30 seconds   seated   Other Knee/Hip Stretches knee flexion stretch seated x5 10" holds R      Knee/Hip Exercises: Aerobic  Stationary Bike seat 9 full revolutions 4 minutes      Knee/Hip Exercises: Standing   Lateral Step Up Both;5 reps;Hand Hold: 2;Step Height: 4";4 sets    Forward Step Up Right;3 sets;5 reps;Hand Hold: 1;Step Height: 4"    Step Down Right;3 sets;5 reps;Hand Hold: 2;Step Height: 4"      Knee/Hip Exercises: Seated   Sit to Sand without UE support   x12 slow and controlled - focus on right side     Knee/Hip Exercises: Supine   Knee Extension AROM    Knee Flexion AROM    Other Supine Knee/Hip Exercises knee bends on green ball 4 minutes - 10 second holds                    PT Education - 08/09/21 1602     Education Details on current presentention on FOTO score. on HEP on POC moving forward.    Person(s) Educated Patient    Methods Explanation    Comprehension Verbalized understanding              PT Short Term Goals - 08/09/21 1552       PT SHORT TERM GOAL #1   Title Patient will demonstrate at leat 2-120 degrees of right knee ROM    Time 3     Period Weeks    Status On-going    Target Date 08/12/21      PT SHORT TERM GOAL #2   Title Patient will report at least 50% improvement in overall symptoms and/or function to demonstrate improved functional mobility    Time 3    Period Weeks    Status Achieved    Target Date 08/12/21      PT SHORT TERM GOAL #3   Title Patient will be independent in self management strategies to improve quality of life and functional outcomes.    Baseline doing exercises every other day    Time 3    Period Weeks    Status On-going    Target Date 08/12/21               PT Long Term Goals - 08/09/21 1557       PT LONG TERM GOAL #1   Title Patient will report at least 75% improvement in overall symptoms and/or function to demonstrate improved functional mobility    Time 6    Period Weeks    Status On-going      PT LONG TERM GOAL #2   Title Patient will be able to walk at least 226 feet without AD in 2 minutes    Baseline 236 but antalgic with lateral sway    Time 6    Period Weeks    Status Partially Met      PT LONG TERM GOAL #3   Title Patient will improve on FOTO score to meet predicted outcomes to demonstrate improved functional mobility.    Baseline predicted 57% function current 58% function    Time 6    Period Weeks    Status Achieved                   Plan - 08/09/21 1603     Clinical Impression Statement Patient has met 1/3 short term goals and has met 2/3 long term goals. Overall patient is doing very well but continues to have swelling and weakness as primary limiting factor at this time. Answered all questions and reviewed FOTO score on this date. Will  continue to work on functional strength and ROM as tolerated.    Personal Factors and Comorbidities Comorbidity 1    Comorbidities previous left TKA    Examination-Activity Limitations Bathing;Sleep;Squat;Stairs;Stand;Transfers;Hygiene/Grooming;Lift;Locomotion Level;Dressing;Carry    Examination-Participation  Restrictions Laundry;Meal Prep;Medication Management;Driving;Community Activity;Shop;Cleaning;Church    Stability/Clinical Decision Making Stable/Uncomplicated    Rehab Potential Good    PT Frequency 3x / week    PT Duration 6 weeks    PT Treatment/Interventions ADLs/Self Care Home Management;Aquatic Therapy;Cryotherapy;Electrical Stimulation;Moist Heat;Traction;Balance training;Therapeutic exercise;Manual techniques;Therapeutic activities;Gait training;Patient/family education;Neuromuscular re-education;Dry needling;Passive range of motion    PT Next Visit Plan functional strength, descending stairs, walking without cane, continue ROM and edema management as indicated.    PT Home Exercise Plan heel slides, walking, quad isometric, LAQ with as needed assist. 8/22: LAQ without assist, SAQ, scar mobilization    Consulted and Agree with Plan of Care Patient             Patient will benefit from skilled therapeutic intervention in order to improve the following deficits and impairments:  Pain, Decreased strength, Decreased activity tolerance, Decreased balance, Difficulty walking, Decreased mobility, Decreased range of motion, Increased edema, Decreased skin integrity, Decreased endurance  Visit Diagnosis: Chronic pain of right knee  Decreased range of motion (ROM) of right knee  Difficulty in walking, not elsewhere classified  Muscle weakness (generalized)     Problem List Patient Active Problem List   Diagnosis Date Noted   Tibial plateau fracture, left, closed, initial encounter 09/21/2020   Special screening for malignant neoplasms, colon   4:10 PM, 08/09/21 Jerene Pitch, DPT Physical Therapy with [facility]  [phone] office   [place], [place], [address] Phone: [phone]   Fax:  [phone]  Name: [name] MRN: [id number] Date of Birth: 03-17-1946

## 2021-08-12 ENCOUNTER — Ambulatory Visit (HOSPITAL_COMMUNITY): Payer: Medicare Other | Admitting: Physical Therapy

## 2021-08-12 ENCOUNTER — Other Ambulatory Visit: Payer: Self-pay

## 2021-08-12 DIAGNOSIS — M25661 Stiffness of right knee, not elsewhere classified: Secondary | ICD-10-CM | POA: Diagnosis not present

## 2021-08-12 DIAGNOSIS — M6281 Muscle weakness (generalized): Secondary | ICD-10-CM

## 2021-08-12 DIAGNOSIS — M25561 Pain in right knee: Secondary | ICD-10-CM | POA: Diagnosis not present

## 2021-08-12 DIAGNOSIS — G8929 Other chronic pain: Secondary | ICD-10-CM

## 2021-08-12 DIAGNOSIS — R262 Difficulty in walking, not elsewhere classified: Secondary | ICD-10-CM | POA: Diagnosis not present

## 2021-08-12 NOTE — Therapy (Signed)
[facility] [address] [place], [place], [address] Phone: [phone]   Fax:  [phone]  Physical Therapy Treatment  Patient Details  Name: [name] MRN: [id number] Date of Birth: 1946/08/28 Referring Provider (PT): Gean Birchwood MD   Encounter Date: 08/12/2021   PT End of Session - 08/12/21 1445     Visit Number 10    Number of Visits 18    Date for PT Re-Evaluation 09/02/21    Authorization Type [employer]HC medicare, visit based on medical necessity follow MCR guidelines    Progress Note Due on Visit 19    PT Start Time 1405    PT Stop Time 1445    PT Time Calculation (min) 40 min    Activity Tolerance Patient tolerated treatment well    Behavior During Therapy [facility] for tasks assessed/performed             Past Medical History:  Diagnosis Date   Anemia    Anxiety    Arthritis    Essential hypertension    Hyperlipidemia    Pre-diabetes     Past Surgical History:  Procedure Laterality Date   ABDOMINAL HYSTERECTOMY     BREAST CYST EXCISION Right    x2   COLONOSCOPY     COLONOSCOPY N/A 11/28/2015   Procedure: COLONOSCOPY;  Surgeon: Corbin Ade, MD;  [facility];  Service: Endoscopy;  Laterality: N/A;  1:00 PM   Right  breast lumpectomy  1975   benign   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   Subjective Assessment - 08/12/21 1420     Subjective pt states she is doing well today.  Been doing alot of walking and going in/out house using steps.  Comes today without AD.    Currently in Pain? No/denies                               [facility] Adult PT Treatment/Exercise - 08/12/21 0001       Knee/Hip Exercises: Aerobic   Stationary Bike seat 9 full revolutions 4 minutes      Knee/Hip Exercises:  Standing   Heel Raises 15 reps    Lateral Step Up Both;Step Height: 4";4 sets;10 reps;Hand Hold: 1    Forward Step Up Right;10 reps;Step Height: 4";Hand Hold: 1    Step Down Right;Hand Hold: 2;Step Height: 4";10 reps      Knee/Hip Exercises: Seated   Sit to Sand without UE support      Knee/Hip Exercises: Supine   Knee Extension AROM    Knee Extension Limitations 0    Knee Flexion AROM    Knee Flexion Limitations 115      Manual Therapy   Manual Therapy Soft tissue mobilization    Manual therapy comments completed seperately from all other skilled interventions    Soft tissue mobilization to improve Rt knee flexion, scar massage                      PT Short Term Goals - 08/09/21 1552       PT SHORT TERM GOAL #1   Title Patient will demonstrate at leat 2-120 degrees of right knee ROM    Time 3  Period Weeks    Status On-going    Target Date 08/12/21      PT SHORT TERM GOAL #2   Title Patient will report at least 50% improvement in overall symptoms and/or function to demonstrate improved functional mobility    Time 3    Period Weeks    Status Achieved    Target Date 08/12/21      PT SHORT TERM GOAL #3   Title Patient will be independent in self management strategies to improve quality of life and functional outcomes.    Baseline doing exercises every other day    Time 3    Period Weeks    Status On-going    Target Date 08/12/21               PT Long Term Goals - 08/09/21 1557       PT LONG TERM GOAL #1   Title Patient will report at least 75% improvement in overall symptoms and/or function to demonstrate improved functional mobility    Time 6    Period Weeks    Status On-going      PT LONG TERM GOAL #2   Title Patient will be able to walk at least 226 feet without AD in 2 minutes    Baseline 236 but antalgic with lateral sway    Time 6    Period Weeks    Status Partially Met      PT LONG TERM GOAL #3   Title Patient will improve on  FOTO score to meet predicted outcomes to demonstrate improved functional mobility.    Baseline predicted 57% function current 58% function    Time 6    Period Weeks    Status Achieved                   Plan - 08/12/21 1446     Clinical Impression Statement Pt continues to do well with decreasing edema and increasing function.  Request warm up on bike and continued step ups this session using 4" step.  Pt required cues for form and eccentric control using 1 HR for assist with lateral and forward step ups.     Pt required use of both UE's for step downs due to weakness.  Lunges began onto 4" step without use of UE to challenge stability with good form and challenge noted. Pt with 110 degrees knee flexion prior to scar massage and then able to complete 115 degrees following. Pt required several seated rest breaks during session today due to fatigue.    Personal Factors and Comorbidities Comorbidity 1    Comorbidities previous left TKA    Examination-Activity Limitations Bathing;Sleep;Squat;Stairs;Stand;Transfers;Hygiene/Grooming;Lift;Locomotion Level;Dressing;Carry    Examination-Participation Restrictions Laundry;Meal Prep;Medication Management;Driving;Community Activity;Shop;Cleaning;Church    Stability/Clinical Decision Making Stable/Uncomplicated    Rehab Potential Good    PT Frequency 3x / week    PT Duration 6 weeks    PT Treatment/Interventions ADLs/Self Care Home Management;Aquatic Therapy;Cryotherapy;Electrical Stimulation;Moist Heat;Traction;Balance training;Therapeutic exercise;Manual techniques;Therapeutic activities;Gait training;Patient/family education;Neuromuscular re-education;Dry needling;Passive range of motion    PT Next Visit Plan functional strength, descending stairs, walking without cane, continue ROM and edema management as indicated.    PT Home Exercise Plan heel slides, walking, quad isometric, LAQ with as needed assist. 8/22: LAQ without assist, SAQ, scar  mobilization    Consulted and Agree with Plan of Care Patient             Patient will benefit from skilled therapeutic intervention in order to  improve the following deficits and impairments:  Pain, Decreased strength, Decreased activity tolerance, Decreased balance, Difficulty walking, Decreased mobility, Decreased range of motion, Increased edema, Decreased skin integrity, Decreased endurance  Visit Diagnosis: Decreased range of motion (ROM) of right knee  Chronic pain of right knee  Difficulty in walking, not elsewhere classified  Muscle weakness (generalized)     Problem List Patient Active Problem List   Diagnosis Date Noted   Tibial plateau fracture, left, closed, initial encounter 09/21/2020   Special screening for malignant neoplasms, colon    Teena Irani, PTA/CLT [phone]  Teena Irani 08/12/2021, 2:48 PM  [place], [place], [address] Phone: [phone]   Fax:  [phone]  Name: CARNELIA OSCAR MRN: [id number] Date of Birth: 1946-11-06

## 2021-08-14 ENCOUNTER — Other Ambulatory Visit: Payer: Self-pay

## 2021-08-14 ENCOUNTER — Ambulatory Visit (HOSPITAL_COMMUNITY): Payer: Medicare Other | Admitting: Physical Therapy

## 2021-08-14 ENCOUNTER — Encounter (HOSPITAL_COMMUNITY): Payer: Self-pay | Admitting: Physical Therapy

## 2021-08-14 DIAGNOSIS — M25561 Pain in right knee: Secondary | ICD-10-CM

## 2021-08-14 DIAGNOSIS — G8929 Other chronic pain: Secondary | ICD-10-CM

## 2021-08-14 DIAGNOSIS — M25661 Stiffness of right knee, not elsewhere classified: Secondary | ICD-10-CM

## 2021-08-14 DIAGNOSIS — R262 Difficulty in walking, not elsewhere classified: Secondary | ICD-10-CM | POA: Diagnosis not present

## 2021-08-14 DIAGNOSIS — M6281 Muscle weakness (generalized): Secondary | ICD-10-CM

## 2021-08-14 DIAGNOSIS — E1165 Type 2 diabetes mellitus with hyperglycemia: Secondary | ICD-10-CM | POA: Diagnosis not present

## 2021-08-14 DIAGNOSIS — I10 Essential (primary) hypertension: Secondary | ICD-10-CM | POA: Diagnosis not present

## 2021-08-14 NOTE — Therapy (Signed)
[name], [place], [address] Phone: [phone]   Fax:  [phone]  Physical Therapy Treatment  Patient Details  Name: [name] MRN: [id number] Date of Birth: 1946-06-21 Referring Provider (PT): Frederik Pear MD   Encounter Date: 08/14/2021   PT End of Session - 08/14/21 1521     Visit Number 11    Number of Visits 18    Date for PT Re-Evaluation 09/02/21    Authorization Type [employer] medicare, visit based on medical necessity follow MCR guidelines    Progress Note Due on Visit 19    PT Start Time 1530    PT Stop Time 1611    PT Time Calculation (min) 41 min    Activity Tolerance Patient tolerated treatment well    Behavior During Therapy [facility] for tasks assessed/performed             Past Medical History:  Diagnosis Date   Anemia    Anxiety    Arthritis    Essential hypertension    Hyperlipidemia    Pre-diabetes     Past Surgical History:  Procedure Laterality Date   ABDOMINAL HYSTERECTOMY     BREAST CYST EXCISION Right    x2   COLONOSCOPY     COLONOSCOPY N/A 11/28/2015   Procedure: COLONOSCOPY;  Surgeon: Daneil Dolin, MD;  [facility];  Service: Endoscopy;  Laterality: N/A;  1:00 PM   Right  breast lumpectomy  1975   benign   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   Subjective Assessment - 08/14/21 1532     Subjective reports no pain just stiffness.    Currently in Pain? No/denies                [facility] PT Assessment - 08/14/21 0001       Assessment   Medical Diagnosis R TKA    Referring Provider (PT) Frederik Pear MD    Onset Date/Surgical Date 07/19/21    Next MD Visit 08/27/21                           [facility] Adult PT Treatment/Exercise - 08/14/21 0001        Ambulation/Gait   Ambulation/Gait Yes    Ambulation/Gait Assistance 5: Supervision    Ambulation Distance (Feet) 226 Feet    Assistive device None    Ambulation Surface Level;Indoor    Stairs Yes    Stairs Assistance 6: Modified independent (Device/Increase time)    Stair Management Technique One rail Left;Alternating pattern    Number of Stairs 7    Height of Stairs 4    Gait Comments x8 reps - verbal cues; walking focus on fast walking, pushing self forward and moving right arm      Knee/Hip Exercises: Stretches   Gastroc Stretch Both;3 reps;30 seconds    Gastroc Stretch Limitations slant board      Knee/Hip Exercises: Aerobic   Stationary Bike seat 9 full revolutions 3 minutes level 3      Knee/Hip Exercises: Standing   Knee Flexion AROM;Right;3 sets;5 reps    SLS x3 30" holds B   occasional UE support   Other Standing Knee Exercises staggered heel raise as pre gait activity 2x5  5" holds B; educated patient on gait mechancis      Knee/Hip Exercises: Seated   Long Arc Quad Right   x3 20" holds                     PT Short Term Goals - 08/09/21 1552       PT SHORT TERM GOAL #1   Title Patient will demonstrate at leat 2-120 degrees of right knee ROM    Time 3    Period Weeks    Status On-going    Target Date 08/12/21      PT SHORT TERM GOAL #2   Title Patient will report at least 50% improvement in overall symptoms and/or function to demonstrate improved functional mobility    Time 3    Period Weeks    Status Achieved    Target Date 08/12/21      PT SHORT TERM GOAL #3   Title Patient will be independent in self management strategies to improve quality of life and functional outcomes.    Baseline doing exercises every other day    Time 3    Period Weeks    Status On-going    Target Date 08/12/21               PT Long Term Goals - 08/09/21 1557       PT LONG TERM GOAL #1   Title Patient will report at least 75% improvement in overall  symptoms and/or function to demonstrate improved functional mobility    Time 6    Period Weeks    Status On-going      PT LONG TERM GOAL #2   Title Patient will be able to walk at least 226 feet without AD in 2 minutes    Baseline 236 but antalgic with lateral sway    Time 6    Period Weeks    Status Partially Met      PT LONG TERM GOAL #3   Title Patient will improve on FOTO score to meet predicted outcomes to demonstrate improved functional mobility.    Baseline predicted 57% function current 58% function    Time 6    Period Weeks    Status Achieved                   Plan - 08/14/21 1524     Clinical Impression Statement Session continue to focus on improving overall functional strength and ROM. Patient continues to limp, this improved with verbal cues and practicing quick paced walking. Fatigue in legs noted with exercises and overall cardiovascular fatigue. Allowed for rest breaks as needed. Will continue with current POC as tolerated.    Personal Factors and Comorbidities Comorbidity 1    Comorbidities previous left TKA    Examination-Activity Limitations Bathing;Sleep;Squat;Stairs;Stand;Transfers;Hygiene/Grooming;Lift;Locomotion Level;Dressing;Carry    Examination-Participation Restrictions Laundry;Meal Prep;Medication Management;Driving;Community Activity;Shop;Cleaning;Church    Stability/Clinical Decision Making Stable/Uncomplicated    Rehab Potential Good    PT Frequency 3x / week    PT Duration 6 weeks    PT Treatment/Interventions ADLs/Self Care Home Management;Aquatic Therapy;Cryotherapy;Electrical Stimulation;Moist Heat;Traction;Balance training;Therapeutic exercise;Manual techniques;Therapeutic activities;Gait training;Patient/family education;Neuromuscular re-education;Dry needling;Passive range of motion    PT Next Visit Plan functional strength, descending stairs, walking without cane, continue ROM and edema management as indicated.    PT Home Exercise Plan  heel slides, walking, quad isometric, LAQ with as needed assist. 8/22: LAQ without assist, SAQ, scar mobilization    Consulted and Agree with Plan of  Care Patient             Patient will benefit from skilled therapeutic intervention in order to improve the following deficits and impairments:  Pain, Decreased strength, Decreased activity tolerance, Decreased balance, Difficulty walking, Decreased mobility, Decreased range of motion, Increased edema, Decreased skin integrity, Decreased endurance  Visit Diagnosis: Decreased range of motion (ROM) of right knee  Chronic pain of right knee  Difficulty in walking, not elsewhere classified  Muscle weakness (generalized)     Problem List Patient Active Problem List   Diagnosis Date Noted   Tibial plateau fracture, left, closed, initial encounter 09/21/2020   Special screening for malignant neoplasms, colon    4:14 PM, 08/14/21 Jerene Pitch, DPT Physical Therapy with [facility]  [phone] office   [place] [address] [place], [place], [address] Phone: [phone]   Fax:  [phone]  Name: CATELYNN SPARGER MRN: [id number] Date of Birth: Jul 23, 1946

## 2021-08-21 ENCOUNTER — Other Ambulatory Visit: Payer: Self-pay

## 2021-08-21 ENCOUNTER — Ambulatory Visit (HOSPITAL_COMMUNITY): Payer: Medicare Other | Attending: Internal Medicine

## 2021-08-21 ENCOUNTER — Encounter (HOSPITAL_COMMUNITY): Payer: Self-pay

## 2021-08-21 DIAGNOSIS — M6281 Muscle weakness (generalized): Secondary | ICD-10-CM | POA: Insufficient documentation

## 2021-08-21 DIAGNOSIS — M25561 Pain in right knee: Secondary | ICD-10-CM | POA: Diagnosis not present

## 2021-08-21 DIAGNOSIS — G8929 Other chronic pain: Secondary | ICD-10-CM | POA: Diagnosis not present

## 2021-08-21 DIAGNOSIS — M25661 Stiffness of right knee, not elsewhere classified: Secondary | ICD-10-CM | POA: Diagnosis not present

## 2021-08-21 DIAGNOSIS — R262 Difficulty in walking, not elsewhere classified: Secondary | ICD-10-CM | POA: Diagnosis not present

## 2021-08-21 NOTE — Therapy (Signed)
[place], [place], [address] Phone: [phone]   Fax:  [phone]  Physical Therapy Treatment  Patient Details  Name: [name] MRN: [id number] Date of Birth: 1946/01/31 Referring Provider (PT): Frederik Pear MD   Encounter Date: 08/21/2021   PT End of Session - 08/21/21 1537     Visit Number 12    Number of Visits 18    Date for PT Re-Evaluation 09/02/21    Authorization Type [employer] medicare, visit based on medical necessity follow MCR guidelines    Progress Note Due on Visit 19    PT Start Time 1532    PT Stop Time 1615    PT Time Calculation (min) 43 min    Activity Tolerance Patient tolerated treatment well    Behavior During Therapy [facility] for tasks assessed/performed             Past Medical History:  Diagnosis Date   Anemia    Anxiety    Arthritis    Essential hypertension    Hyperlipidemia    Pre-diabetes     Past Surgical History:  Procedure Laterality Date   ABDOMINAL HYSTERECTOMY     BREAST CYST EXCISION Right    x2   COLONOSCOPY     COLONOSCOPY N/A 11/28/2015   Procedure: COLONOSCOPY;  Surgeon: Daneil Dolin, MD;  [facility];  Service: Endoscopy;  Laterality: N/A;  1:00 PM   Right  breast lumpectomy  1975   benign   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   Subjective Assessment - 08/21/21 1536     Subjective Knee is feeling good, no reports of pain just stiffness and occasional swelling.    Pertinent History previous left knee    Patient Stated Goals to be able to stay independent    Currently in Pain? No/denies                [facility] PT Assessment - 08/21/21 0001       Assessment   Medical Diagnosis R TKA    Referring Provider (PT) Frederik Pear MD    Onset  Date/Surgical Date 07/19/21    Next MD Visit 08/27/21    Prior Therapy yes for left knee replacement                           [facility] Adult PT Treatment/Exercise - 08/21/21 0001       Ambulation/Gait   Ambulation/Gait Yes    Ambulation/Gait Assistance 5: Supervision    Ambulation Distance (Feet) 326 Feet    Assistive device None    Ambulation Surface Level;Indoor    Stairs Yes    Stair Management Technique One rail Left;Alternating pattern    Number of Stairs 4    Height of Stairs 7   3RT   Gait Comments 2MWT, good arm swing and equal stride length, noted decreased DF Lt LE with heel strike.; 3RT first step to pattern, reciprocal pattern 4in then 7in      Knee/Hip Exercises: Stretches   Gastroc Stretch Both;3 reps;30 seconds    Gastroc Stretch Limitations slant board      Knee/Hip Exercises: Aerobic   Stationary Bike seat 9 full revolutions 3 minutes level 3  Knee/Hip Exercises: Standing   Heel Raises 15 reps    Heel Raises Limitations incline slope, minimal hand use    Lateral Step Up Right;10 reps;Hand Hold: 1;Step Height: 4"    Step Down Right;Step Height: 4";10 reps;Hand Hold: 1    Functional Squat 10 reps    Functional Squat Limitations cueing for mechanics    Stairs 4in then 7in reciprocal pattern    Other Standing Knee Exercises sidestep down blue line x 2RT with RTB around thigh      Knee/Hip Exercises: Seated   Sit to Sand 10 reps;without UE support   eccentric control                 Upper Extremity Functional Index Score :   /80     PT Short Term Goals - 08/09/21 1552       PT SHORT TERM GOAL #1   Title Patient will demonstrate at leat 2-120 degrees of right knee ROM    Time 3    Period Weeks    Status On-going    Target Date 08/12/21      PT SHORT TERM GOAL #2   Title Patient will report at least 50% improvement in overall symptoms and/or function to demonstrate improved functional mobility    Time 3    Period Weeks     Status Achieved    Target Date 08/12/21      PT SHORT TERM GOAL #3   Title Patient will be independent in self management strategies to improve quality of life and functional outcomes.    Baseline doing exercises every other day    Time 3    Period Weeks    Status On-going    Target Date 08/12/21               PT Long Term Goals - 08/09/21 1557       PT LONG TERM GOAL #1   Title Patient will report at least 75% improvement in overall symptoms and/or function to demonstrate improved functional mobility    Time 6    Period Weeks    Status On-going      PT LONG TERM GOAL #2   Title Patient will be able to walk at least 226 feet without AD in 2 minutes    Baseline 236 but antalgic with lateral sway    Time 6    Period Weeks    Status Partially Met      PT LONG TERM GOAL #3   Title Patient will improve on FOTO score to meet predicted outcomes to demonstrate improved functional mobility.    Baseline predicted 57% function current 58% function    Time 6    Period Weeks    Status Achieved                   Plan - 08/21/21 1604     Clinical Impression Statement Pt is progressing well towards POC.  Improved gait mechaincs with appropriate arm swing and equal stride length, increased cadence noted during 2MWT.  Progressed functional strengthening with reciprocal pattern stairs with increased height, noted increased difficulty descending stairs due to quad weakness.  Added squats and sidestep for functional strengthening.  Pt able to complete whole sessoin with no reports of pain through session.    Personal Factors and Comorbidities Comorbidity 1    Comorbidities previous left TKA    Examination-Activity Limitations Bathing;Sleep;Squat;Stairs;Stand;Transfers;Hygiene/Grooming;Lift;Locomotion Level;Dressing;Carry    Examination-Participation Restrictions Laundry;Meal Prep;Medication Management;Driving;Community Activity;Shop;Cleaning;Church  Stability/Clinical  Decision Making Stable/Uncomplicated    Clinical Decision Making Low    Rehab Potential Good    PT Frequency 3x / week    PT Duration 6 weeks    PT Treatment/Interventions ADLs/Self Care Home Management;Aquatic Therapy;Cryotherapy;Electrical Stimulation;Moist Heat;Traction;Balance training;Therapeutic exercise;Manual techniques;Therapeutic activities;Gait training;Patient/family education;Neuromuscular re-education;Dry needling;Passive range of motion    PT Next Visit Plan Progress note next session prior MD apt.  functional strength, descending stairs, walking without cane, continue ROM and edema management as indicated.    PT Home Exercise Plan heel slides, walking, quad isometric, LAQ with as needed assist. 8/22: LAQ without assist, SAQ, scar mobilization    Consulted and Agree with Plan of Care Patient             Patient will benefit from skilled therapeutic intervention in order to improve the following deficits and impairments:  Pain, Decreased strength, Decreased activity tolerance, Decreased balance, Difficulty walking, Decreased mobility, Decreased range of motion, Increased edema, Decreased skin integrity, Decreased endurance  Visit Diagnosis: Decreased range of motion (ROM) of right knee  Chronic pain of right knee  Muscle weakness (generalized)  Difficulty in walking, not elsewhere classified     Problem List Patient Active Problem List   Diagnosis Date Noted   Tibial plateau fracture, left, closed, initial encounter 09/21/2020   Special screening for malignant neoplasms, colon    Ihor Austin, LPTA/CLT; CBIS [phone]  Aldona Lento, PTA 08/21/2021, 4:29 PM  [facility] [address] [place], [place], [address] Phone: [phone]   Fax:  [phone]  Name: SOLENE HEREFORD MRN: [id number] Date of Birth: [birth year]

## 2021-08-23 ENCOUNTER — Ambulatory Visit (HOSPITAL_COMMUNITY): Payer: Medicare Other | Admitting: Physical Therapy

## 2021-08-23 ENCOUNTER — Encounter (HOSPITAL_COMMUNITY): Payer: Self-pay | Admitting: Physical Therapy

## 2021-08-23 ENCOUNTER — Other Ambulatory Visit: Payer: Self-pay

## 2021-08-23 DIAGNOSIS — R262 Difficulty in walking, not elsewhere classified: Secondary | ICD-10-CM

## 2021-08-23 DIAGNOSIS — M25661 Stiffness of right knee, not elsewhere classified: Secondary | ICD-10-CM | POA: Diagnosis not present

## 2021-08-23 DIAGNOSIS — G8929 Other chronic pain: Secondary | ICD-10-CM

## 2021-08-23 DIAGNOSIS — M6281 Muscle weakness (generalized): Secondary | ICD-10-CM

## 2021-08-23 DIAGNOSIS — M25561 Pain in right knee: Secondary | ICD-10-CM | POA: Diagnosis not present

## 2021-08-23 NOTE — Therapy (Signed)
[facility] [address] [place], [place], [address] Phone: [phone]   Fax:  [phone]  Physical Therapy Treatment and Progress Note and Discharge note  Patient Details  Name: [name] MRN: [id number] Date of Birth: [birth year] Referring Provider (PT): Frederik Pear MD   PHYSICAL THERAPY DISCHARGE SUMMARY  Visits from Start of Care: 13  Current functional level related to goals / functional outcomes: All goals met   Remaining deficits: stairs   Education / Equipment: See below   Patient agrees to discharge. Patient goals were met. Patient is being discharged due to meeting the stated rehab goals.    Progress Note Reporting Period 08/09/21 to 08/23/21  See note below for Objective Data and Assessment of Progress/Goals.      Encounter Date: 08/23/2021   PT End of Session - 08/23/21 1323     Visit Number 13    Number of Visits 18    Date for PT Re-Evaluation 09/02/21    Authorization Type [employer] medicare, visit based on medical necessity follow MCR guidelines    Progress Note Due on Visit 71    PT Start Time 1316    PT Stop Time 1355    PT Time Calculation (min) 39 min    Activity Tolerance Patient tolerated treatment well    Behavior During Therapy WFL for tasks assessed/performed             Past Medical History:  Diagnosis Date   Anemia    Anxiety    Arthritis    Essential hypertension    Hyperlipidemia    Pre-diabetes     Past Surgical History:  Procedure Laterality Date   ABDOMINAL HYSTERECTOMY     BREAST CYST EXCISION Right    x2   COLONOSCOPY     COLONOSCOPY N/A 11/28/2015   Procedure: COLONOSCOPY;  Surgeon: Daneil Dolin, MD;  [facility];  Service: Endoscopy;  Laterality: N/A;  1:00 PM   Right  breast lumpectomy  1975   benign   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   Subjective Assessment - 08/23/21 1320     Subjective Reports no pain just tenderness over incision and overall 95% better since the start of PT. States going up and down stairs are still challenging. R    Pertinent History previous left knee    Patient Stated Goals to be able to stay independent                [facility] PT Assessment - 08/23/21 0001       Assessment   Medical Diagnosis R TKA    Referring Provider (PT) Frederik Pear MD    Onset Date/Surgical Date 07/19/21    Next MD Visit 08/27/21      Observation/Other Assessments   Focus on Therapeutic Outcomes (FOTO)  62% function, predicted 57% function      AROM   Right Knee Extension 0   resting in 2 (lacking) but able to achieve 0   Right Knee Flexion 120      Ambulation/Gait   Ambulation/Gait Yes    Ambulation/Gait Assistance 7: Independent    Ambulation Distance (Feet) 448 Feet    Assistive device None    Gait Pattern Within Functional Limits    Ambulation Surface Level;Indoor  Stairs Yes    Stairs Assistance 6: Modified independent (Device/Increase time)    Stair Management Technique Two rails;Alternating pattern    Number of Stairs 4    Height of Stairs 7    Gait Comments 2MW                           [facility] Adult PT Treatment/Exercise - 08/23/21 0001       Knee/Hip Exercises: Stretches   Active Hamstring Stretch Right;3 reps;30 seconds    Active Hamstring Stretch Limitations seated      Knee/Hip Exercises: Aerobic   Stationary Bike seat 9 full revolutions 4 minutes level 3                     PT Education - 08/23/21 1356     Education Details on FOTO score, on HEP, on progress, on continues swelling and benefits of compression garments.    Person(s) Educated Patient    Methods Explanation    Comprehension Verbalized understanding              PT Short Term Goals - 08/23/21 1327        PT SHORT TERM GOAL #1   Title Patient will demonstrate at leat 2-120 degrees of right knee ROM    Baseline 0-120    Time 3    Period Weeks    Status Achieved    Target Date 08/12/21      PT SHORT TERM GOAL #2   Title Patient will report at least 50% improvement in overall symptoms and/or function to demonstrate improved functional mobility    Baseline 95% better    Time 3    Period Weeks    Status Achieved    Target Date 08/12/21      PT SHORT TERM GOAL #3   Title Patient will be independent in self management strategies to improve quality of life and functional outcomes.    Baseline doing exercises every other day    Time 3    Period Weeks    Status Achieved    Target Date 08/12/21               PT Long Term Goals - 08/23/21 1327       PT LONG TERM GOAL #1   Title Patient will report at least 75% improvement in overall symptoms and/or function to demonstrate improved functional mobility    Baseline 95% better    Time 6    Period Weeks    Status Achieved      PT LONG TERM GOAL #2   Title Patient will be able to walk at least 226 feet without AD in 2 minutes    Baseline see objective    Time 6    Period Weeks    Status Achieved      PT LONG TERM GOAL #3   Title Patient will improve on FOTO score to meet predicted outcomes to demonstrate improved functional mobility.    Baseline predicted 57% function current 62% function    Time 6    Period Weeks    Status Achieved                   Plan - 08/23/21 1356     Clinical Impression Statement All goals met at this time. Answered all questions and reviewed HEP. Patient would like to transition to HEP at this time secondary to progress made.  Reviewed HEP and importance of getting compression garments secondary to continues swelling. Patient to discharge from PT to HEP at this time.    Personal Factors and Comorbidities Comorbidity 1    Comorbidities previous left TKA    Examination-Activity  Limitations Bathing;Sleep;Squat;Stairs;Stand;Transfers;Hygiene/Grooming;Lift;Locomotion Level;Dressing;Carry    Examination-Participation Restrictions Laundry;Meal Prep;Medication Management;Driving;Community Activity;Shop;Cleaning;Church    Stability/Clinical Decision Making Stable/Uncomplicated    Rehab Potential Good    PT Frequency 3x / week    PT Duration 6 weeks    PT Treatment/Interventions ADLs/Self Care Home Management;Aquatic Therapy;Cryotherapy;Electrical Stimulation;Moist Heat;Traction;Balance training;Therapeutic exercise;Manual techniques;Therapeutic activities;Gait training;Patient/family education;Neuromuscular re-education;Dry needling;Passive range of motion    PT Next Visit Plan DC to HEP    PT Home Exercise Plan heel slides, walking, quad isometric, LAQ with as needed assist. 8/22: LAQ without assist, SAQ, scar mobilization    Consulted and Agree with Plan of Care Patient             Patient will benefit from skilled therapeutic intervention in order to improve the following deficits and impairments:  Pain, Decreased strength, Decreased activity tolerance, Decreased balance, Difficulty walking, Decreased mobility, Decreased range of motion, Increased edema, Decreased skin integrity, Decreased endurance  Visit Diagnosis: Decreased range of motion (ROM) of right knee  Chronic pain of right knee  Muscle weakness (generalized)  Difficulty in walking, not elsewhere classified     Problem List Patient Active Problem List   Diagnosis Date Noted   Tibial plateau fracture, left, closed, initial encounter 09/21/2020   Special screening for malignant neoplasms, colon    1:58 PM, 08/23/21 Jerene Pitch, DPT Physical Therapy with [facility]  [phone] office   [place], [place], [address] Phone: [phone]   Fax:  [phone]  Name: [name] MRN:  [id number] Date of Birth: 10-Dec-1946

## 2021-08-27 DIAGNOSIS — Z96642 Presence of left artificial hip joint: Secondary | ICD-10-CM | POA: Diagnosis not present

## 2021-08-27 DIAGNOSIS — Z96651 Presence of right artificial knee joint: Secondary | ICD-10-CM | POA: Diagnosis not present

## 2021-08-27 DIAGNOSIS — Z9889 Other specified postprocedural states: Secondary | ICD-10-CM | POA: Diagnosis not present

## 2021-08-28 ENCOUNTER — Encounter (HOSPITAL_COMMUNITY): Payer: Medicare Other | Admitting: Physical Therapy

## 2021-08-30 ENCOUNTER — Encounter (HOSPITAL_COMMUNITY): Payer: Medicare Other | Admitting: Physical Therapy

## 2021-09-13 DIAGNOSIS — E1165 Type 2 diabetes mellitus with hyperglycemia: Secondary | ICD-10-CM | POA: Diagnosis not present

## 2021-09-13 DIAGNOSIS — I10 Essential (primary) hypertension: Secondary | ICD-10-CM | POA: Diagnosis not present

## 2021-09-18 ENCOUNTER — Other Ambulatory Visit: Payer: Self-pay | Admitting: Internal Medicine

## 2021-09-18 DIAGNOSIS — Z1231 Encounter for screening mammogram for malignant neoplasm of breast: Secondary | ICD-10-CM

## 2021-10-02 DIAGNOSIS — Z23 Encounter for immunization: Secondary | ICD-10-CM | POA: Diagnosis not present

## 2021-10-18 ENCOUNTER — Other Ambulatory Visit: Payer: Self-pay

## 2021-10-18 ENCOUNTER — Ambulatory Visit
Admission: RE | Admit: 2021-10-18 | Discharge: 2021-10-18 | Disposition: A | Discharge status: Home / Self Care | Payer: Medicare Other | Source: Ambulatory Visit | Attending: Internal Medicine | Admitting: Internal Medicine

## 2021-10-18 DIAGNOSIS — Z1231 Encounter for screening mammogram for malignant neoplasm of breast: Secondary | ICD-10-CM

## 2021-10-24 ENCOUNTER — Other Ambulatory Visit: Payer: Self-pay | Admitting: Internal Medicine

## 2021-10-24 DIAGNOSIS — R928 Other abnormal and inconclusive findings on diagnostic imaging of breast: Secondary | ICD-10-CM

## 2021-11-20 ENCOUNTER — Ambulatory Visit
Admission: RE | Admit: 2021-11-20 | Discharge: 2021-11-20 | Disposition: A | Discharge status: Home / Self Care | Payer: Medicare Other | Source: Ambulatory Visit | Attending: Internal Medicine | Admitting: Internal Medicine

## 2021-11-20 ENCOUNTER — Other Ambulatory Visit: Payer: Self-pay | Admitting: Internal Medicine

## 2021-11-20 DIAGNOSIS — R928 Other abnormal and inconclusive findings on diagnostic imaging of breast: Secondary | ICD-10-CM

## 2021-11-20 DIAGNOSIS — N6489 Other specified disorders of breast: Secondary | ICD-10-CM | POA: Diagnosis not present

## 2021-11-20 DIAGNOSIS — R922 Inconclusive mammogram: Secondary | ICD-10-CM | POA: Diagnosis not present

## 2021-12-25 DIAGNOSIS — E1165 Type 2 diabetes mellitus with hyperglycemia: Secondary | ICD-10-CM | POA: Diagnosis not present

## 2021-12-25 DIAGNOSIS — I10 Essential (primary) hypertension: Secondary | ICD-10-CM | POA: Diagnosis not present

## 2022-01-01 DIAGNOSIS — M170 Bilateral primary osteoarthritis of knee: Secondary | ICD-10-CM | POA: Diagnosis not present

## 2022-01-01 DIAGNOSIS — E782 Mixed hyperlipidemia: Secondary | ICD-10-CM | POA: Diagnosis not present

## 2022-01-01 DIAGNOSIS — I10 Essential (primary) hypertension: Secondary | ICD-10-CM | POA: Diagnosis not present

## 2022-01-01 DIAGNOSIS — M545 Low back pain, unspecified: Secondary | ICD-10-CM | POA: Diagnosis not present

## 2022-01-01 DIAGNOSIS — R600 Localized edema: Secondary | ICD-10-CM | POA: Diagnosis not present

## 2022-01-01 DIAGNOSIS — F5101 Primary insomnia: Secondary | ICD-10-CM | POA: Diagnosis not present

## 2022-01-01 DIAGNOSIS — E1165 Type 2 diabetes mellitus with hyperglycemia: Secondary | ICD-10-CM | POA: Diagnosis not present

## 2022-02-11 DIAGNOSIS — I10 Essential (primary) hypertension: Secondary | ICD-10-CM | POA: Diagnosis not present

## 2022-02-11 DIAGNOSIS — E1165 Type 2 diabetes mellitus with hyperglycemia: Secondary | ICD-10-CM | POA: Diagnosis not present

## 2022-03-28 DIAGNOSIS — E119 Type 2 diabetes mellitus without complications: Secondary | ICD-10-CM | POA: Diagnosis not present

## 2022-03-28 DIAGNOSIS — H43811 Vitreous degeneration, right eye: Secondary | ICD-10-CM | POA: Diagnosis not present

## 2022-03-28 DIAGNOSIS — H25013 Cortical age-related cataract, bilateral: Secondary | ICD-10-CM | POA: Diagnosis not present

## 2022-03-28 DIAGNOSIS — H1013 Acute atopic conjunctivitis, bilateral: Secondary | ICD-10-CM | POA: Diagnosis not present

## 2022-05-23 ENCOUNTER — Other Ambulatory Visit: Payer: Self-pay | Admitting: Internal Medicine

## 2022-05-23 ENCOUNTER — Ambulatory Visit
Admission: RE | Admit: 2022-05-23 | Discharge: 2022-05-23 | Disposition: A | Discharge status: Home / Self Care | Payer: Medicare Other | Source: Ambulatory Visit | Attending: Internal Medicine | Admitting: Internal Medicine

## 2022-05-23 DIAGNOSIS — R928 Other abnormal and inconclusive findings on diagnostic imaging of breast: Secondary | ICD-10-CM | POA: Diagnosis not present

## 2022-05-23 DIAGNOSIS — N6489 Other specified disorders of breast: Secondary | ICD-10-CM

## 2022-06-25 DIAGNOSIS — E1165 Type 2 diabetes mellitus with hyperglycemia: Secondary | ICD-10-CM | POA: Diagnosis not present

## 2022-06-25 DIAGNOSIS — I10 Essential (primary) hypertension: Secondary | ICD-10-CM | POA: Diagnosis not present

## 2022-06-25 DIAGNOSIS — E782 Mixed hyperlipidemia: Secondary | ICD-10-CM | POA: Diagnosis not present

## 2022-07-01 DIAGNOSIS — I10 Essential (primary) hypertension: Secondary | ICD-10-CM | POA: Diagnosis not present

## 2022-07-01 DIAGNOSIS — E782 Mixed hyperlipidemia: Secondary | ICD-10-CM | POA: Diagnosis not present

## 2022-07-01 DIAGNOSIS — M545 Low back pain, unspecified: Secondary | ICD-10-CM | POA: Diagnosis not present

## 2022-07-01 DIAGNOSIS — F5101 Primary insomnia: Secondary | ICD-10-CM | POA: Diagnosis not present

## 2022-07-01 DIAGNOSIS — M170 Bilateral primary osteoarthritis of knee: Secondary | ICD-10-CM | POA: Diagnosis not present

## 2022-07-01 DIAGNOSIS — E1165 Type 2 diabetes mellitus with hyperglycemia: Secondary | ICD-10-CM | POA: Diagnosis not present

## 2022-07-01 DIAGNOSIS — R140 Abdominal distension (gaseous): Secondary | ICD-10-CM | POA: Diagnosis not present

## 2022-08-14 DIAGNOSIS — M170 Bilateral primary osteoarthritis of knee: Secondary | ICD-10-CM | POA: Diagnosis not present

## 2022-08-14 DIAGNOSIS — I10 Essential (primary) hypertension: Secondary | ICD-10-CM | POA: Diagnosis not present

## 2022-08-14 DIAGNOSIS — E1165 Type 2 diabetes mellitus with hyperglycemia: Secondary | ICD-10-CM | POA: Diagnosis not present

## 2022-08-14 DIAGNOSIS — E785 Hyperlipidemia, unspecified: Secondary | ICD-10-CM | POA: Diagnosis not present

## 2022-10-03 DIAGNOSIS — Z23 Encounter for immunization: Secondary | ICD-10-CM | POA: Diagnosis not present

## 2022-10-03 DIAGNOSIS — Z0000 Encounter for general adult medical examination without abnormal findings: Secondary | ICD-10-CM | POA: Diagnosis not present

## 2022-10-30 ENCOUNTER — Ambulatory Visit
Admission: RE | Admit: 2022-10-30 | Discharge: 2022-10-30 | Disposition: A | Discharge status: Home / Self Care | Payer: Medicare Other | Source: Ambulatory Visit | Attending: Internal Medicine | Admitting: Internal Medicine

## 2022-10-30 DIAGNOSIS — N6489 Other specified disorders of breast: Secondary | ICD-10-CM | POA: Diagnosis not present

## 2022-12-26 DIAGNOSIS — E1165 Type 2 diabetes mellitus with hyperglycemia: Secondary | ICD-10-CM | POA: Diagnosis not present

## 2022-12-26 DIAGNOSIS — I10 Essential (primary) hypertension: Secondary | ICD-10-CM | POA: Diagnosis not present

## 2023-01-01 DIAGNOSIS — E1165 Type 2 diabetes mellitus with hyperglycemia: Secondary | ICD-10-CM | POA: Diagnosis not present

## 2023-01-01 DIAGNOSIS — F5101 Primary insomnia: Secondary | ICD-10-CM | POA: Diagnosis not present

## 2023-01-01 DIAGNOSIS — G8929 Other chronic pain: Secondary | ICD-10-CM | POA: Diagnosis not present

## 2023-01-01 DIAGNOSIS — I10 Essential (primary) hypertension: Secondary | ICD-10-CM | POA: Diagnosis not present

## 2023-01-01 DIAGNOSIS — M170 Bilateral primary osteoarthritis of knee: Secondary | ICD-10-CM | POA: Diagnosis not present

## 2023-01-01 DIAGNOSIS — Z79899 Other long term (current) drug therapy: Secondary | ICD-10-CM | POA: Diagnosis not present

## 2023-01-01 DIAGNOSIS — M545 Low back pain, unspecified: Secondary | ICD-10-CM | POA: Diagnosis not present

## 2023-01-01 DIAGNOSIS — E782 Mixed hyperlipidemia: Secondary | ICD-10-CM | POA: Diagnosis not present

## 2023-01-01 DIAGNOSIS — Z0000 Encounter for general adult medical examination without abnormal findings: Secondary | ICD-10-CM | POA: Diagnosis not present

## 2023-02-22 NOTE — Progress Notes (Signed)
    Cardiology Office Note  Date: 02/23/2023   ID: MATAYA KILDUFF, DOB 08/20/1946, MRN [id number]  History of Present Illness: [name] is a 77 y.o. female last seen in December 2021.  She is here for a follow-up visit.  In the interim she does not report any sense of palpitations, no exertional chest pain or unusual shortness of breath with typical activities.  No dizziness or syncope.  She has had both knees replaced, generally doing well although still does have some lower back pain.  She has not had to use any Lasix for treatment of leg swelling other than rarely in the last several months.  I reviewed her medications, antihypertensive regimen includes Toprol-XL and Norvasc.  Her ECG today shows sinus bradycardia with right bundle branch block and leftward axis.  Physical Exam: VS:  BP 138/72   Pulse 70   Ht 5\' 1"  (1.549 m)   Wt 163 lb (73.9 kg)   SpO2 97%   BMI 30.80 kg/m , BMI Body mass index is 30.8 kg/m.  Wt Readings from Last 3 Encounters:  02/23/23 163 lb (73.9 kg)  11/23/20 157 lb 12.8 oz (71.6 kg)  09/24/20 159 lb (72.1 kg)    General: Patient appears comfortable at rest. HEENT: Conjunctiva and lids normal. Neck: Supple, no elevated JVP or carotid bruits. Lungs: Clear to auscultation, nonlabored breathing at rest. Cardiac: Regular rate and rhythm, no S3 or significant systolic murmur. Extremities: No pitting edema.  ECG:  An ECG dated 09/11/2020 was personally reviewed today and demonstrated:  Sinus rhythm with right bundle branch block.  Labwork:  January 2024: Hemoglobin 12.7, platelets 190, BUN 12, creatinine 0.8, potassium 4.4, AST 19, ALT 14, cholesterol 202, triglycerides 78, HDL 69, LDL 119  Other Studies Reviewed Today:  Echocardiogram 11/28/2020:  1. Left ventricular ejection fraction, by estimation, is 60 to 65%. The  left ventricle has normal function. The left ventricle has no regional  wall motion abnormalities. Left  ventricular diastolic parameters are  indeterminate. Normal global longitudinal   strain of -19.0%.   2. Right ventricular systolic function is normal. The right ventricular  size is normal. There is normal pulmonary artery systolic pressure. The  estimated right ventricular systolic pressure is 09.7 mmHg.   3. The mitral valve is grossly normal. Trivial mitral valve  regurgitation.   4. The aortic valve is grossly normal. Aortic valve regurgitation is not  visualized.   5. The inferior vena cava is normal in size with greater than 50%  respiratory variability, suggesting right atrial pressure of 3 mmHg.   Assessment and Plan:  1.  History of PSVT.  Quiescent at this time, no significant palpitations.  She continues on Toprol-XL as part of her antihypertensive regimen.  ECG reviewed and stable.  Will continue with observation.  2.  Essential hypertension.  Currently on Norvasc and Toprol-XL.  Systolic is in the 353G today, I did not make any changes.  Keep follow-up with PCP.  Disposition:  Follow up prn  Signed, Satira Sark, M.D., F.A.C.C.

## 2023-02-23 ENCOUNTER — Ambulatory Visit: Payer: Medicare Other | Attending: Cardiology | Admitting: Cardiology

## 2023-02-23 ENCOUNTER — Encounter: Payer: Self-pay | Admitting: Cardiology

## 2023-02-23 VITALS — BP 138/72 | HR 70 | Ht 61.0 in | Wt 163.0 lb

## 2023-02-23 DIAGNOSIS — I471 Supraventricular tachycardia, unspecified: Secondary | ICD-10-CM | POA: Diagnosis not present

## 2023-02-23 DIAGNOSIS — I10 Essential (primary) hypertension: Secondary | ICD-10-CM

## 2023-02-23 NOTE — Patient Instructions (Signed)
Medication Instructions:  Your physician recommends that you continue on your current medications as directed. Please refer to the Current Medication list given to you today.   Labwork: None  Testing/Procedures: None  Follow-Up: Follow up with Dr. McDowell as needed.   Any Other Special Instructions Will Be Listed Below (If Applicable).     If you need a refill on your cardiac medications before your next appointment, please call your pharmacy.  

## 2023-06-30 DIAGNOSIS — E782 Mixed hyperlipidemia: Secondary | ICD-10-CM | POA: Diagnosis not present

## 2023-06-30 DIAGNOSIS — E1165 Type 2 diabetes mellitus with hyperglycemia: Secondary | ICD-10-CM | POA: Diagnosis not present

## 2023-06-30 DIAGNOSIS — I10 Essential (primary) hypertension: Secondary | ICD-10-CM | POA: Diagnosis not present

## 2023-07-06 DIAGNOSIS — Z87891 Personal history of nicotine dependence: Secondary | ICD-10-CM | POA: Diagnosis not present

## 2023-07-06 DIAGNOSIS — M170 Bilateral primary osteoarthritis of knee: Secondary | ICD-10-CM | POA: Diagnosis not present

## 2023-07-06 DIAGNOSIS — H6123 Impacted cerumen, bilateral: Secondary | ICD-10-CM | POA: Diagnosis not present

## 2023-07-06 DIAGNOSIS — Z0001 Encounter for general adult medical examination with abnormal findings: Secondary | ICD-10-CM | POA: Diagnosis not present

## 2023-07-06 DIAGNOSIS — G8929 Other chronic pain: Secondary | ICD-10-CM | POA: Diagnosis not present

## 2023-07-06 DIAGNOSIS — F5101 Primary insomnia: Secondary | ICD-10-CM | POA: Diagnosis not present

## 2023-07-06 DIAGNOSIS — I10 Essential (primary) hypertension: Secondary | ICD-10-CM | POA: Diagnosis not present

## 2023-07-06 DIAGNOSIS — E782 Mixed hyperlipidemia: Secondary | ICD-10-CM | POA: Diagnosis not present

## 2023-07-06 DIAGNOSIS — J309 Allergic rhinitis, unspecified: Secondary | ICD-10-CM | POA: Diagnosis not present

## 2023-07-06 DIAGNOSIS — E1165 Type 2 diabetes mellitus with hyperglycemia: Secondary | ICD-10-CM | POA: Diagnosis not present

## 2023-07-07 ENCOUNTER — Other Ambulatory Visit (HOSPITAL_COMMUNITY): Payer: Self-pay | Admitting: Family Medicine

## 2023-07-07 DIAGNOSIS — Z13820 Encounter for screening for osteoporosis: Secondary | ICD-10-CM

## 2023-07-30 ENCOUNTER — Ambulatory Visit (HOSPITAL_COMMUNITY)
Admission: RE | Admit: 2023-07-30 | Discharge: 2023-07-30 | Disposition: A | Discharge status: Home / Self Care | Payer: Medicare Other | Source: Ambulatory Visit | Attending: Family Medicine | Admitting: Family Medicine

## 2023-07-30 DIAGNOSIS — Z780 Asymptomatic menopausal state: Secondary | ICD-10-CM | POA: Diagnosis not present

## 2023-07-30 DIAGNOSIS — Z13820 Encounter for screening for osteoporosis: Secondary | ICD-10-CM | POA: Diagnosis not present

## 2023-07-30 DIAGNOSIS — M85852 Other specified disorders of bone density and structure, left thigh: Secondary | ICD-10-CM | POA: Diagnosis not present

## 2023-11-10 ENCOUNTER — Other Ambulatory Visit (HOSPITAL_COMMUNITY): Payer: Self-pay | Admitting: Internal Medicine

## 2023-11-10 DIAGNOSIS — N6489 Other specified disorders of breast: Secondary | ICD-10-CM

## 2023-11-30 DIAGNOSIS — Z23 Encounter for immunization: Secondary | ICD-10-CM | POA: Diagnosis not present

## 2023-12-22 ENCOUNTER — Ambulatory Visit (HOSPITAL_COMMUNITY)
Admission: RE | Admit: 2023-12-22 | Discharge: 2023-12-22 | Disposition: A | Discharge status: Home / Self Care | Payer: Medicare Other | Source: Ambulatory Visit | Attending: Internal Medicine | Admitting: Internal Medicine

## 2023-12-22 DIAGNOSIS — N6489 Other specified disorders of breast: Secondary | ICD-10-CM | POA: Insufficient documentation

## 2023-12-22 DIAGNOSIS — R928 Other abnormal and inconclusive findings on diagnostic imaging of breast: Secondary | ICD-10-CM | POA: Diagnosis not present

## 2023-12-22 DIAGNOSIS — R92323 Mammographic fibroglandular density, bilateral breasts: Secondary | ICD-10-CM | POA: Diagnosis not present

## 2023-12-30 DIAGNOSIS — E1165 Type 2 diabetes mellitus with hyperglycemia: Secondary | ICD-10-CM | POA: Diagnosis not present

## 2023-12-30 DIAGNOSIS — I10 Essential (primary) hypertension: Secondary | ICD-10-CM | POA: Diagnosis not present

## 2024-01-08 DIAGNOSIS — M545 Low back pain, unspecified: Secondary | ICD-10-CM | POA: Diagnosis not present

## 2024-01-08 DIAGNOSIS — G8929 Other chronic pain: Secondary | ICD-10-CM | POA: Diagnosis not present

## 2024-01-08 DIAGNOSIS — F5101 Primary insomnia: Secondary | ICD-10-CM | POA: Diagnosis not present

## 2024-01-08 DIAGNOSIS — E1165 Type 2 diabetes mellitus with hyperglycemia: Secondary | ICD-10-CM | POA: Diagnosis not present

## 2024-01-08 DIAGNOSIS — M170 Bilateral primary osteoarthritis of knee: Secondary | ICD-10-CM | POA: Diagnosis not present

## 2024-01-08 DIAGNOSIS — Z5320 Procedure and treatment not carried out because of patient's decision for unspecified reasons: Secondary | ICD-10-CM | POA: Diagnosis not present

## 2024-01-08 DIAGNOSIS — I10 Essential (primary) hypertension: Secondary | ICD-10-CM | POA: Diagnosis not present

## 2024-01-08 DIAGNOSIS — E782 Mixed hyperlipidemia: Secondary | ICD-10-CM | POA: Diagnosis not present

## 2024-01-08 DIAGNOSIS — R600 Localized edema: Secondary | ICD-10-CM | POA: Diagnosis not present

## 2024-06-02 DIAGNOSIS — E1165 Type 2 diabetes mellitus with hyperglycemia: Secondary | ICD-10-CM | POA: Diagnosis not present

## 2024-06-02 DIAGNOSIS — I10 Essential (primary) hypertension: Secondary | ICD-10-CM | POA: Diagnosis not present

## 2024-06-08 DIAGNOSIS — M170 Bilateral primary osteoarthritis of knee: Secondary | ICD-10-CM | POA: Diagnosis not present

## 2024-06-08 DIAGNOSIS — G8929 Other chronic pain: Secondary | ICD-10-CM | POA: Diagnosis not present

## 2024-06-08 DIAGNOSIS — I10 Essential (primary) hypertension: Secondary | ICD-10-CM | POA: Diagnosis not present

## 2024-06-08 DIAGNOSIS — E782 Mixed hyperlipidemia: Secondary | ICD-10-CM | POA: Diagnosis not present

## 2024-06-08 DIAGNOSIS — E1165 Type 2 diabetes mellitus with hyperglycemia: Secondary | ICD-10-CM | POA: Diagnosis not present

## 2024-06-08 DIAGNOSIS — Z79899 Other long term (current) drug therapy: Secondary | ICD-10-CM | POA: Diagnosis not present

## 2024-06-08 DIAGNOSIS — Z5320 Procedure and treatment not carried out because of patient's decision for unspecified reasons: Secondary | ICD-10-CM | POA: Diagnosis not present

## 2024-06-08 DIAGNOSIS — F5101 Primary insomnia: Secondary | ICD-10-CM | POA: Diagnosis not present

## 2024-06-08 DIAGNOSIS — M545 Low back pain, unspecified: Secondary | ICD-10-CM | POA: Diagnosis not present

## 2024-06-08 DIAGNOSIS — R600 Localized edema: Secondary | ICD-10-CM | POA: Diagnosis not present

## 2024-08-01 DIAGNOSIS — Z1211 Encounter for screening for malignant neoplasm of colon: Secondary | ICD-10-CM | POA: Diagnosis not present

## 2024-08-24 ENCOUNTER — Encounter: Payer: Self-pay | Admitting: Gastroenterology

## 2024-09-26 DIAGNOSIS — E782 Mixed hyperlipidemia: Secondary | ICD-10-CM | POA: Diagnosis not present

## 2024-09-26 DIAGNOSIS — Z23 Encounter for immunization: Secondary | ICD-10-CM | POA: Diagnosis not present

## 2024-09-30 ENCOUNTER — Other Ambulatory Visit: Payer: Self-pay | Admitting: Nurse Practitioner

## 2024-09-30 DIAGNOSIS — M7989 Other specified soft tissue disorders: Secondary | ICD-10-CM

## 2024-10-04 NOTE — Progress Notes (Unsigned)
 GI Office Note    Referring Provider: Joeann Browning, FNP Primary Care Physician:  Joeann Browning, FNP  Primary Gastroenterologist: Lamar HERO.Rourk, MD  Chief Complaint   No chief complaint on file.  History of Present Illness   [name] is a 78 y.o. female presenting today at the request of Joeann Browning, FNP for positive Cologuard.   Colonoscopy 2016: - Normal  - no future colonoscopy recommended unless new symptoms.   Per referral she had positive Cologuard. PCP discussed screening with her and notified that repeat colonoscopy not recommended givne age but patient preferred to proceed therefore Cologuard ordered.   Positive Cologuard 08/01/24.  Today:  Discussed the use of AI scribe software for clinical note transcription with the patient, who gave verbal consent to proceed.  Wt Readings from Last 6 Encounters:  02/23/23 163 lb (73.9 kg)  11/23/20 157 lb 12.8 oz (71.6 kg)  09/24/20 159 lb (72.1 kg)  09/12/20 159 lb (72.1 kg)  05/25/18 161 lb (73 kg)  04/29/16 156 lb (70.8 kg)    There is no height or weight on file to calculate BMI.  Current Outpatient Medications  Medication Sig Dispense Refill   amLODipine (NORVASC) 10 MG tablet Take 10 mg by mouth daily.     Black Pepper-Turmeric (TURMERIC CURCUMIN) 04-999 MG CAPS Take 1 capsule by mouth daily.      Calcium Carbonate-Vit D-Min (CALTRATE 600+D PLUS PO) Take 1 tablet by mouth daily.     clorazepate (TRANXENE) 7.5 MG tablet Take 7.5 mg by mouth daily as needed for anxiety.      diclofenac Sodium (VOLTAREN) 1 % GEL Apply 2 g topically 2 (two) times daily as needed (pain).     ferrous sulfate 325 (65 FE) MG tablet Take 325 mg by mouth daily with breakfast.     furosemide (LASIX) 20 MG tablet Take 20 mg by mouth daily as needed for fluid.      glucosamine-chondroitin 500-400 MG tablet Take 1 tablet by mouth daily.      metoprolol succinate (TOPROL-XL) 50 MG 24 hr tablet Take 50 mg by mouth daily.      Multiple Vitamins-Minerals (CENTRUM SILVER PO) Take 1 tablet by mouth daily.      naproxen  (NAPROSYN ) 500 MG tablet Take 1 tablet (500 mg total) by mouth 2 (two) times daily with a meal. (Patient taking differently: Take 500 mg by mouth 2 (two) times daily as needed for moderate pain.) 60 tablet 5   traMADol  (ULTRAM ) 50 MG tablet Take 50 mg by mouth every 6 (six) hours as needed for moderate pain.      trolamine salicylate (ASPERCREME) 10 % cream Apply 1 application topically as needed for muscle pain.     No current facility-administered medications for this visit.    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/2020   Procedure: LEFT TOTAL KNEE ARTHROPLASTY;  Surgeon: Liam Lerner,  MD;  Location: [facility];  Service: Orthopedics;  Laterality: Left;   TOTAL KNEE ARTHROPLASTY Right     Family History  Problem Relation Age of Onset   Hypertension Mother    Lung cancer Mother    Hypertension Father    CAD Father    Prostate cancer Father    Breast cancer Neg Hx     Allergies as of 10/05/2024   (No Known Allergies)    Social History   Socioeconomic History   Marital status: Widowed    Spouse name: Not on file   Number of children: Not on file   Years of education: Not on file   Highest education level: Not on file  Occupational History   Not on file  Tobacco Use   Smoking status: Former    Current packs/day: 0.00    Average packs/day: 0.3 packs/day for 40.0 years (10.0 ttl pk-yrs)    Types: Cigarettes    Start  date: 11/27/1969    Quit date: 11/27/2009    Years since quitting: 14.8   Smokeless tobacco: Never  Vaping Use   Vaping status: Every Day  Substance and Sexual Activity   Alcohol use: Yes    Comment: Socially   Drug use: No   Sexual activity: Not on file  Other Topics Concern   Not on file  Social History Narrative   Not on file   Social Drivers of Health   Financial Resource Strain: Not on file  Food Insecurity: Not on file  Transportation Needs: Not on file  Physical Activity: Not on file  Stress: Not on file  Social Connections: Not on file  Intimate Partner Violence: Not on file     Review of Systems   Gen: Denies any fever, chills, fatigue, weight loss, lack of appetite.  CV: Denies chest pain, heart palpitations, peripheral edema, syncope.  Resp: Denies shortness of breath at rest or with exertion. Denies wheezing or cough.  GI: see HPI GU : Denies urinary burning, urinary frequency, urinary hesitancy MS: Denies joint pain, muscle weakness, cramps, or limitation of movement.  Derm: Denies rash, itching, dry skin Psych: Denies depression, anxiety, memory loss, and confusion Heme: Denies bruising, bleeding, and enlarged lymph nodes.  Physical Exam   There were no vitals taken for this visit.  General:   Alert and oriented. Pleasant and cooperative. Well-nourished and well-developed.  Head:  Normocephalic and atraumatic. Eyes:  Without icterus, sclera clear and conjunctiva pink.  Ears:  Normal auditory acuity. Mouth:  No deformity or lesions, oral mucosa pink.  Lungs:  Clear to auscultation bilaterally. No wheezes, rales, or rhonchi. No distress.  Heart:  S1, S2 present without murmurs appreciated.  Abdomen:  +BS, soft, non-tender and non-distended. No HSM noted. No guarding or rebound. No masses appreciated.  Rectal:  deferred *** Msk:  Symmetrical without gross deformities. Normal posture. Extremities:  Without edema. Neurologic:  Alert and  oriented x4;   grossly normal neurologically. Skin:  Intact without significant lesions or rashes. Psych:  Alert and cooperative. Normal mood and affect.  Assessment & Plan   [name] is a 78 y.o. female with a history of diabetes, HLD, HTN, obesity, and osteoarthritis presenting today with ***   Proceed with colonoscopy with propofol  by Dr. Shaaron in near future: the risks, benefits, and alternatives have been discussed with the patient in detail. The patient states understanding and desires to proceed. ASA ***  Hold iron one week  Follow up   ***Follow  up ***   Charmaine Melia, MSN, FNP-BC, AGACNP-BC [facility]

## 2024-10-04 NOTE — H&P (View-Only) (Signed)
 GI Office Note    Referring Provider: Joeann Browning, FNP Primary Care Physician:  Joeann Browning, FNP  Primary Gastroenterologist: Lamar HERO.Rourk, MD  Chief Complaint   No chief complaint on file.  History of Present Illness   [name] is a 78 y.o. female presenting today at the request of Joeann Browning, FNP for positive Cologuard.   Colonoscopy 2016: - Normal  - no future colonoscopy recommended unless new symptoms.   Per referral she had positive Cologuard. PCP discussed screening with her and notified that repeat colonoscopy not recommended givne age but patient preferred to proceed therefore Cologuard ordered.   Positive Cologuard 08/01/24.  Today:  Discussed the use of AI scribe software for clinical note transcription with the patient, who gave verbal consent to proceed.  Wt Readings from Last 6 Encounters:  02/23/23 163 lb (73.9 kg)  11/23/20 157 lb 12.8 oz (71.6 kg)  09/24/20 159 lb (72.1 kg)  09/12/20 159 lb (72.1 kg)  05/25/18 161 lb (73 kg)  04/29/16 156 lb (70.8 kg)    There is no height or weight on file to calculate BMI.  Current Outpatient Medications  Medication Sig Dispense Refill   amLODipine (NORVASC) 10 MG tablet Take 10 mg by mouth daily.     Black Pepper-Turmeric (TURMERIC CURCUMIN) 04-999 MG CAPS Take 1 capsule by mouth daily.      Calcium Carbonate-Vit D-Min (CALTRATE 600+D PLUS PO) Take 1 tablet by mouth daily.     clorazepate (TRANXENE) 7.5 MG tablet Take 7.5 mg by mouth daily as needed for anxiety.      diclofenac Sodium (VOLTAREN) 1 % GEL Apply 2 g topically 2 (two) times daily as needed (pain).     ferrous sulfate 325 (65 FE) MG tablet Take 325 mg by mouth daily with breakfast.     furosemide (LASIX) 20 MG tablet Take 20 mg by mouth daily as needed for fluid.      glucosamine-chondroitin 500-400 MG tablet Take 1 tablet by mouth daily.      metoprolol succinate (TOPROL-XL) 50 MG 24 hr tablet Take 50 mg by mouth daily.      Multiple Vitamins-Minerals (CENTRUM SILVER PO) Take 1 tablet by mouth daily.      naproxen  (NAPROSYN ) 500 MG tablet Take 1 tablet (500 mg total) by mouth 2 (two) times daily with a meal. (Patient taking differently: Take 500 mg by mouth 2 (two) times daily as needed for moderate pain.) 60 tablet 5   traMADol  (ULTRAM ) 50 MG tablet Take 50 mg by mouth every 6 (six) hours as needed for moderate pain.      trolamine salicylate (ASPERCREME) 10 % cream Apply 1 application topically as needed for muscle pain.     No current facility-administered medications for this visit.    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/2020   Procedure: LEFT TOTAL KNEE ARTHROPLASTY;  Surgeon: Liam Lerner,  MD;  Location: [facility];  Service: Orthopedics;  Laterality: Left;   TOTAL KNEE ARTHROPLASTY Right     Family History  Problem Relation Age of Onset   Hypertension Mother    Lung cancer Mother    Hypertension Father    CAD Father    Prostate cancer Father    Breast cancer Neg Hx     Allergies as of 10/05/2024   (No Known Allergies)    Social History   Socioeconomic History   Marital status: Widowed    Spouse name: Not on file   Number of children: Not on file   Years of education: Not on file   Highest education level: Not on file  Occupational History   Not on file  Tobacco Use   Smoking status: Former    Current packs/day: 0.00    Average packs/day: 0.3 packs/day for 40.0 years (10.0 ttl pk-yrs)    Types: Cigarettes    Start  date: 11/27/1969    Quit date: 11/27/2009    Years since quitting: 14.8   Smokeless tobacco: Never  Vaping Use   Vaping status: Every Day  Substance and Sexual Activity   Alcohol use: Yes    Comment: Socially   Drug use: No   Sexual activity: Not on file  Other Topics Concern   Not on file  Social History Narrative   Not on file   Social Drivers of Health   Financial Resource Strain: Not on file  Food Insecurity: Not on file  Transportation Needs: Not on file  Physical Activity: Not on file  Stress: Not on file  Social Connections: Not on file  Intimate Partner Violence: Not on file     Review of Systems   Gen: Denies any fever, chills, fatigue, weight loss, lack of appetite.  CV: Denies chest pain, heart palpitations, peripheral edema, syncope.  Resp: Denies shortness of breath at rest or with exertion. Denies wheezing or cough.  GI: see HPI GU : Denies urinary burning, urinary frequency, urinary hesitancy MS: Denies joint pain, muscle weakness, cramps, or limitation of movement.  Derm: Denies rash, itching, dry skin Psych: Denies depression, anxiety, memory loss, and confusion Heme: Denies bruising, bleeding, and enlarged lymph nodes.  Physical Exam   There were no vitals taken for this visit.  General:   Alert and oriented. Pleasant and cooperative. Well-nourished and well-developed.  Head:  Normocephalic and atraumatic. Eyes:  Without icterus, sclera clear and conjunctiva pink.  Ears:  Normal auditory acuity. Mouth:  No deformity or lesions, oral mucosa pink.  Lungs:  Clear to auscultation bilaterally. No wheezes, rales, or rhonchi. No distress.  Heart:  S1, S2 present without murmurs appreciated.  Abdomen:  +BS, soft, non-tender and non-distended. No HSM noted. No guarding or rebound. No masses appreciated.  Rectal:  deferred *** Msk:  Symmetrical without gross deformities. Normal posture. Extremities:  Without edema. Neurologic:  Alert and  oriented x4;   grossly normal neurologically. Skin:  Intact without significant lesions or rashes. Psych:  Alert and cooperative. Normal mood and affect.  Assessment & Plan   [name] is a 78 y.o. female with a history of diabetes, HLD, HTN, obesity, and osteoarthritis presenting today with ***   Proceed with colonoscopy with propofol  by Dr. Shaaron in near future: the risks, benefits, and alternatives have been discussed with the patient in detail. The patient states understanding and desires to proceed. ASA ***  Hold iron one week  Follow up   ***Follow  up ***   Charmaine Melia, MSN, FNP-BC, AGACNP-BC [facility]

## 2024-10-05 ENCOUNTER — Ambulatory Visit: Admitting: Gastroenterology

## 2024-10-05 ENCOUNTER — Other Ambulatory Visit: Payer: Self-pay | Admitting: *Deleted

## 2024-10-05 ENCOUNTER — Encounter: Payer: Self-pay | Admitting: *Deleted

## 2024-10-05 ENCOUNTER — Encounter: Payer: Self-pay | Admitting: Gastroenterology

## 2024-10-05 VITALS — BP 133/65 | HR 81 | Temp 98.2°F | Ht 60.0 in | Wt 158.8 lb

## 2024-10-05 DIAGNOSIS — K5904 Chronic idiopathic constipation: Secondary | ICD-10-CM

## 2024-10-05 DIAGNOSIS — R195 Other fecal abnormalities: Secondary | ICD-10-CM

## 2024-10-05 DIAGNOSIS — K642 Third degree hemorrhoids: Secondary | ICD-10-CM

## 2024-10-05 DIAGNOSIS — D509 Iron deficiency anemia, unspecified: Secondary | ICD-10-CM | POA: Diagnosis not present

## 2024-10-05 DIAGNOSIS — K625 Hemorrhage of anus and rectum: Secondary | ICD-10-CM

## 2024-10-05 MED ORDER — PEG 3350-KCL-NA BICARB-NACL 420 G PO SOLR: 4000.0000 mL | Freq: Once | ORAL | 0 refills | Status: AC

## 2024-10-05 NOTE — Patient Instructions (Signed)
 We will get you scheduled for colonoscopy in the near future to further evaluate your positive Cologuard and your rectal bleeding.  For any rectal bleeding I recommend you applying over-the-counter hemorrhoid cream (rectal hydrocortisone or Preparation H) to the area twice daily for a week.  To help with constipation I recommend you start taking 1 tablespoon of Metamucil in 8 ounces of water  daily over the next 4 to 6 weeks and if you are tolerating this well and feels and that is helping with bowel movements you may increase to twice daily.  Your goal water  intake should be 4 to 6 glasses of water  daily at minimum.  Work toward following a high-fiber diet.  Please look over the hemorrhoid banding pamphlet I have provided to you today, this may be a viable option for you pending your colonoscopy findings.  It was a pleasure to see you today. I want to create trusting relationships with patients. If you receive a survey regarding your visit,  I greatly appreciate you taking time to fill this out on paper or through your MyChart. I value your feedback.  Charmaine Melia, MSN, FNP-BC, AGACNP-BC [facility]

## 2024-10-07 ENCOUNTER — Ambulatory Visit (HOSPITAL_COMMUNITY)
Admission: RE | Admit: 2024-10-07 | Discharge: 2024-10-07 | Disposition: A | Discharge status: Home / Self Care | Source: Ambulatory Visit | Attending: Nurse Practitioner | Admitting: Nurse Practitioner

## 2024-10-07 DIAGNOSIS — M7989 Other specified soft tissue disorders: Secondary | ICD-10-CM | POA: Diagnosis not present

## 2024-10-07 DIAGNOSIS — R221 Localized swelling, mass and lump, neck: Secondary | ICD-10-CM | POA: Diagnosis not present

## 2024-10-24 ENCOUNTER — Ambulatory Visit (HOSPITAL_COMMUNITY)
Admission: RE | Admit: 2024-10-24 | Discharge: 2024-10-24 | Disposition: A | Discharge status: Home / Self Care | Attending: Internal Medicine | Admitting: Internal Medicine

## 2024-10-24 ENCOUNTER — Ambulatory Visit (HOSPITAL_COMMUNITY): Admitting: Anesthesiology

## 2024-10-24 ENCOUNTER — Encounter (HOSPITAL_COMMUNITY): Payer: Self-pay | Admitting: Internal Medicine

## 2024-10-24 ENCOUNTER — Other Ambulatory Visit: Payer: Self-pay

## 2024-10-24 ENCOUNTER — Encounter (HOSPITAL_COMMUNITY)
Admission: RE | Disposition: A | Discharge status: Home / Self Care | Payer: Self-pay | Source: Home / Self Care | Attending: Internal Medicine

## 2024-10-24 DIAGNOSIS — Z87891 Personal history of nicotine dependence: Secondary | ICD-10-CM | POA: Diagnosis not present

## 2024-10-24 DIAGNOSIS — K644 Residual hemorrhoidal skin tags: Secondary | ICD-10-CM

## 2024-10-24 DIAGNOSIS — Z1211 Encounter for screening for malignant neoplasm of colon: Secondary | ICD-10-CM | POA: Diagnosis present

## 2024-10-24 DIAGNOSIS — D120 Benign neoplasm of cecum: Secondary | ICD-10-CM | POA: Insufficient documentation

## 2024-10-24 DIAGNOSIS — Z79899 Other long term (current) drug therapy: Secondary | ICD-10-CM | POA: Diagnosis not present

## 2024-10-24 DIAGNOSIS — F17290 Nicotine dependence, other tobacco product, uncomplicated: Secondary | ICD-10-CM | POA: Insufficient documentation

## 2024-10-24 DIAGNOSIS — E119 Type 2 diabetes mellitus without complications: Secondary | ICD-10-CM | POA: Insufficient documentation

## 2024-10-24 DIAGNOSIS — D123 Benign neoplasm of transverse colon: Secondary | ICD-10-CM | POA: Diagnosis not present

## 2024-10-24 DIAGNOSIS — D649 Anemia, unspecified: Secondary | ICD-10-CM | POA: Diagnosis not present

## 2024-10-24 DIAGNOSIS — D509 Iron deficiency anemia, unspecified: Secondary | ICD-10-CM | POA: Insufficient documentation

## 2024-10-24 DIAGNOSIS — K219 Gastro-esophageal reflux disease without esophagitis: Secondary | ICD-10-CM | POA: Diagnosis not present

## 2024-10-24 DIAGNOSIS — K635 Polyp of colon: Secondary | ICD-10-CM

## 2024-10-24 DIAGNOSIS — F419 Anxiety disorder, unspecified: Secondary | ICD-10-CM | POA: Insufficient documentation

## 2024-10-24 DIAGNOSIS — K625 Hemorrhage of anus and rectum: Secondary | ICD-10-CM

## 2024-10-24 DIAGNOSIS — E669 Obesity, unspecified: Secondary | ICD-10-CM | POA: Insufficient documentation

## 2024-10-24 DIAGNOSIS — M1990 Unspecified osteoarthritis, unspecified site: Secondary | ICD-10-CM | POA: Insufficient documentation

## 2024-10-24 DIAGNOSIS — R195 Other fecal abnormalities: Secondary | ICD-10-CM | POA: Diagnosis not present

## 2024-10-24 DIAGNOSIS — Z6830 Body mass index (BMI) 30.0-30.9, adult: Secondary | ICD-10-CM | POA: Insufficient documentation

## 2024-10-24 DIAGNOSIS — Z791 Long term (current) use of non-steroidal anti-inflammatories (NSAID): Secondary | ICD-10-CM | POA: Diagnosis not present

## 2024-10-24 DIAGNOSIS — I10 Essential (primary) hypertension: Secondary | ICD-10-CM | POA: Insufficient documentation

## 2024-10-24 DIAGNOSIS — K5909 Other constipation: Secondary | ICD-10-CM | POA: Insufficient documentation

## 2024-10-24 DIAGNOSIS — E785 Hyperlipidemia, unspecified: Secondary | ICD-10-CM | POA: Diagnosis not present

## 2024-10-24 DIAGNOSIS — K642 Third degree hemorrhoids: Secondary | ICD-10-CM | POA: Diagnosis not present

## 2024-10-24 DIAGNOSIS — K648 Other hemorrhoids: Secondary | ICD-10-CM

## 2024-10-24 HISTORY — PX: COLONOSCOPY: SHX5424

## 2024-10-24 SURGERY — COLONOSCOPY
Anesthesia: Monitor Anesthesia Care

## 2024-10-24 MED ORDER — LACTATED RINGERS IV SOLN: INTRAVENOUS | Status: DC

## 2024-10-24 MED ORDER — LIDOCAINE 2% (20 MG/ML) 5 ML SYRINGE
INTRAMUSCULAR | Status: DC | PRN
Start: 2024-10-24 — End: 2024-10-24
  Administered 2024-10-24: 60 mg via INTRAVENOUS

## 2024-10-24 MED ORDER — PROPOFOL 10 MG/ML IV BOLUS
INTRAVENOUS | Status: DC | PRN
  Administered 2024-10-24: 70 mg via INTRAVENOUS
  Administered 2024-10-24: 125 ug/kg/min via INTRAVENOUS

## 2024-10-24 NOTE — Anesthesia Preprocedure Evaluation (Signed)
 Anesthesia Evaluation  Patient identified by MRN, date of birth, ID band Patient awake    Reviewed: Allergy & Precautions, H&P , NPO status , Patient's Chart, lab work & pertinent test results, reviewed documented beta blocker date and time   Airway Mallampati: II  TM Distance: >3 FB Neck ROM: full    Dental no notable dental hx.    Pulmonary neg pulmonary ROS, Patient abstained from smoking., former smoker   Pulmonary exam normal breath sounds clear to auscultation       Cardiovascular Exercise Tolerance: Good hypertension,  Rhythm:regular Rate:Normal     Neuro/Psych   Anxiety     negative neurological ROS  negative psych ROS   GI/Hepatic negative GI ROS, Neg liver ROS,,,  Endo/Other  negative endocrine ROS    Renal/GU negative Renal ROS  negative genitourinary   Musculoskeletal   Abdominal   Peds  Hematology  (+) Blood dyscrasia, anemia   Anesthesia Other Findings   Reproductive/Obstetrics negative OB ROS                              Anesthesia Physical Anesthesia Plan  ASA: 2  Anesthesia Plan: MAC   Post-op Pain Management:    Induction:   PONV Risk Score and Plan: Propofol  infusion  Airway Management Planned:   Additional Equipment:   Intra-op Plan:   Post-operative Plan:   Informed Consent: I have reviewed the patients History and Physical, chart, labs and discussed the procedure including the risks, benefits and alternatives for the proposed anesthesia with the patient or authorized representative who has indicated his/her understanding and acceptance.     Dental Advisory Given  Plan Discussed with: CRNA  Anesthesia Plan Comments:         Anesthesia Quick Evaluation

## 2024-10-24 NOTE — Interval H&P Note (Signed)
 History and Physical Interval Note:  10/24/2024 10:35 AM  [name]  has presented today for surgery, with the diagnosis of positive cologuard,rectal bleeding.  The various methods of treatment have been discussed with the patient and family. After consideration of risks, benefits and other options for treatment, the patient has consented to  Procedure(s) with comments: COLONOSCOPY (N/A) - 10:15 am, asa 2 as a surgical intervention.  The patient's history has been reviewed, patient examined, no change in status, stable for surgery.  I have reviewed the patient's chart and labs.  Questions were answered to the patient's satisfaction.     [name]    No change.  Diagnostic colonoscopy today because of a positive Cologuard.  The risks, benefits, limitations, alternatives and imponderables have been reviewed with the patient. Questions have been answered. All parties are agreeable.

## 2024-10-24 NOTE — Discharge Instructions (Signed)
  Colonoscopy Discharge Instructions  Read the instructions outlined below and refer to this sheet in the next few weeks. These discharge instructions provide you with general information on caring for yourself after you leave the hospital. Your doctor may also give you specific instructions. While your treatment has been planned according to the most current medical practices available, unavoidable complications occasionally occur. If you have any problems or questions after discharge, call Dr. Shaaron at [phone]. ACTIVITY You may resume your regular activity, but move at a slower pace for the next 24 hours.  Take frequent rest periods for the next 24 hours.  Walking will help get rid of the air and reduce the bloated feeling in your belly (abdomen).  No driving for 24 hours (because of the medicine (anesthesia) used during the test).   Do not sign any important legal documents or operate any machinery for 24 hours (because of the anesthesia used during the test).  NUTRITION Drink plenty of fluids.  You may resume your normal diet as instructed by your doctor.  Begin with a light meal and progress to your normal diet. Heavy or fried foods are harder to digest and may make you feel sick to your stomach (nauseated).  Avoid alcoholic beverages for 24 hours or as instructed.  MEDICATIONS You may resume your normal medications unless your doctor tells you otherwise.  WHAT YOU CAN EXPECT TODAY Some feelings of bloating in the abdomen.  Passage of more gas than usual.  Spotting of blood in your stool or on the toilet paper.  IF YOU HAD POLYPS REMOVED DURING THE COLONOSCOPY: No aspirin  products for 7 days or as instructed.  No alcohol for 7 days or as instructed.  Eat a soft diet for the next 24 hours.  FINDING OUT THE RESULTS OF YOUR TEST Not all test results are available during your visit. If your test results are not back during the visit, make an appointment with your caregiver to find out the  results. Do not assume everything is normal if you have not heard from your caregiver or the medical facility. It is important for you to follow up on all of your test results.  SEEK IMMEDIATE MEDICAL ATTENTION IF: You have more than a spotting of blood in your stool.  Your belly is swollen (abdominal distention).  You are nauseated or vomiting.  You have a temperature over 101.  You have abdominal pain or discomfort that is severe or gets worse throughout the day.       2 very small polyps found and removed today   you do have significant hemorrhoids   hemorrhoids may be treatable with banding in the office   information on hemorrhoids and pamphlet on hemorrhoid banding provided   further recommendations to follow pending review of pathology report  Office visit with Therisa Stager in 6 weeks for possible hemorrhoid banding.

## 2024-10-24 NOTE — Op Note (Signed)
 [facility] Patient Name: [name] Procedure Date: 10/24/2024 10:27 AM MRN: [id number] Date of Birth: 1946/07/27 Attending MD: Lamar Ozell Hollingshead , MD, [id number] CSN: [id number] Age: 78 Admit Type: Outpatient Procedure:                Colonoscopy Indications:              Positive Cologuard test Providers:                Lamar Ozell Hollingshead, MD, Harlene Lips, Bascom Blush Referring MD:              Medicines:                Propofol  per Anesthesia Complications:            No immediate complications. Estimated Blood Loss:     Estimated blood loss was minimal. Procedure:                Pre-Anesthesia Assessment:                           - Prior to the procedure, a History and Physical                            was performed, and patient medications and                            allergies were reviewed. The patient's tolerance of                            previous anesthesia was also reviewed. The risks                            and benefits of the procedure and the sedation                            options and risks were discussed with the patient.                            All questions were answered, and informed consent                            was obtained. Prior Anticoagulants: The patient has                            taken no anticoagulant or antiplatelet agents. ASA                            Grade Assessment: III - A patient with severe                            systemic disease. After reviewing the risks and  benefits, the patient was deemed in satisfactory                            condition to undergo the procedure.                           After obtaining informed consent, the colonoscope                            was passed under direct vision. Throughout the                            procedure, the patient's blood pressure, pulse, and                            oxygen  saturations were monitored continuously. The                            [id number] ([id number]) Colon was introduced through                            the anus and advanced to the the cecum, identified                            by appendiceal orifice and ileocecal valve. The                            colonoscopy was performed without difficulty. The                            patient tolerated the procedure well. The quality                            of the bowel preparation was adequate. The                            ileocecal valve, appendiceal orifice, and rectum                            were photographed. Scope In: 10:51:20 AM Scope Out: 11:09:13 AM Scope Withdrawal Time: 0 hours 6 minutes 55 seconds  Total Procedure Duration: 0 hours 17 minutes 53 seconds  Findings:      The perianal and digital rectal examinations were normal.      Non-bleeding external and internal hemorrhoids were found during       retroflexion. The hemorrhoids were moderate, medium-sized and Grade III       (internal hemorrhoids that prolapse but require manual reduction).      Two sessile polyps were found in the splenic flexure and cecum. The       polyps were 2 to 3 mm in size. These polyps were removed with a cold       biopsy forceps. Resection and retrieval were complete. Estimated blood       loss was minimal.      The exam was otherwise without abnormality on direct views. Rectal  vault       too small to retroflex. Seen well on?"face.. Impression:               - Non-bleeding external and internal hemorrhoids.                           - Two 2 to 3 mm polyps at the splenic flexure and                            in the cecum, removed with a cold biopsy forceps.                            Resected and retrieved.                           - The examination was otherwise normal on direct                            views. Patient's hemorrhoids are causing symptoms.                            Would be a  reasonably good hemorrhoid banding                            candidate. Moderate Sedation:      Moderate (conscious) sedation was personally administered by an       anesthesia professional. The following parameters were monitored: oxygen       saturation, heart rate, blood pressure, respiratory rate, EKG, adequacy       of pulmonary ventilation, and response to care. Recommendation:           - Patient has a contact number available for                            emergencies. The signs and symptoms of potential                            delayed complications were discussed with the                            patient. Return to normal activities tomorrow.                            Written discharge instructions were provided to the                            patient.                           - Resume previous diet.                           - Continue present medications. Hemorrhoid                            information and hemorrhoid banding pamphlet  provided.                           - Repeat colonoscopy date to be determined after                            pending pathology results are reviewed for                            surveillance.                           - Return to GI office in 6 weeks. Procedure Code(s):        --- Professional ---                           [phone], Colonoscopy, flexible; with biopsy, single                            or multiple Diagnosis Code(s):        --- Professional ---                           D12.3, Benign neoplasm of transverse colon (hepatic                            flexure or splenic flexure)                           D12.0, Benign neoplasm of cecum                           K64.2, Third degree hemorrhoids                           R19.5, Other fecal abnormalities CPT copyright 2022 [employer]. All rights reserved. The codes documented in this report are preliminary and upon coder review may   be revised to meet current compliance requirements. Lamar HERO. Jamelle Noy, MD Lamar Ozell Hollingshead, MD 10/24/2024 11:18:23 AM This report has been signed electronically. Number of Addenda: 0

## 2024-10-24 NOTE — Interval H&P Note (Signed)
 History and Physical Interval Note:  10/24/2024 11:30 AM  [name]  has presented today for surgery, with the diagnosis of positive cologuard,rectal bleeding.  The various methods of treatment have been discussed with the patient and family. After consideration of risks, benefits and other options for treatment, the patient has consented to  Procedure(s) with comments: COLONOSCOPY (N/A) - 10:15 am, asa 2 as a surgical intervention.  The patient's history has been reviewed, patient examined, no change in status, stable for surgery.  I have reviewed the patient's chart and labs.  Questions were answered to the patient's satisfaction.     Lamar Hollingshead

## 2024-10-24 NOTE — Transfer of Care (Addendum)
 Immediate Anesthesia Transfer of Care Note  Patient: [name]  Procedure(s) Performed: COLONOSCOPY  Patient Location: Endoscopy Unit  Anesthesia Type:General  Level of Consciousness: drowsy and patient cooperative  Airway & Oxygen Therapy: Patient Spontanous Breathing  Post-op Assessment: Report given to RN and Post -op Vital signs reviewed and stable  Post vital signs: Reviewed and stable  Last Vitals:  Vitals Value Taken Time  BP 104/49 10/24/24   1115  Temp 36.5 10/24/24   1115  Pulse 73 10/24/24   1115  Resp 20 10/24/24   1115  SpO2 93% 10/24/24   1115    Last Pain:  Vitals:   10/24/24 1045  TempSrc:   PainSc: 0-No pain      Patients Stated Pain Goal: 8 (10/24/24 0858)  Complications: No notable events documented.

## 2024-10-24 NOTE — Anesthesia Procedure Notes (Signed)
 Date/Time: 10/24/2024 10:45 AM  Performed by: Para Jerelene CROME, CRNAOxygen Delivery Method: Nasal cannula

## 2024-10-25 ENCOUNTER — Encounter (HOSPITAL_COMMUNITY): Payer: Self-pay | Admitting: Internal Medicine

## 2024-10-25 LAB — SURGICAL PATHOLOGY

## 2024-10-27 ENCOUNTER — Ambulatory Visit: Payer: Self-pay | Admitting: Internal Medicine

## 2024-10-27 NOTE — Anesthesia Postprocedure Evaluation (Signed)
 Anesthesia Post Note  Patient: [name]  Procedure(s) Performed: COLONOSCOPY  Patient location during evaluation: Phase II Anesthesia Type: MAC Level of consciousness: awake Pain management: pain level controlled Vital Signs Assessment: post-procedure vital signs reviewed and stable Respiratory status: spontaneous breathing and respiratory function stable Cardiovascular status: blood pressure returned to baseline and stable Postop Assessment: no headache and no apparent nausea or vomiting Anesthetic complications: no Comments: Late entry   No notable events documented.   Last Vitals:  Vitals:   10/24/24 1115 10/24/24 1121  BP: (!) 104/49 125/66  Pulse: 73 71  Resp: 20 20  Temp: 36.5 C   SpO2: 93% 96%    Last Pain:  Vitals:   10/24/24 1121  TempSrc:   PainSc: 0-No pain                 Yvonna JINNY Bosworth

## 2024-11-29 ENCOUNTER — Telehealth: Payer: Self-pay | Admitting: Gastroenterology

## 2024-11-29 NOTE — Telephone Encounter (Signed)
 Patient cancelled hemorrhoid banding with Charmaine. Patient states she is doing well and does not want to consider it right now. She will call back if she needs appointment.

## 2024-12-06 ENCOUNTER — Encounter: Admitting: Gastroenterology
